# Patient Record
Sex: Female | Born: 1960 | Race: Black or African American | Hispanic: No | Marital: Married | Smoking: Current every day smoker
Health system: Southern US, Community
[De-identification: ages and names within clinical notes are randomized; demographics above are authoritative.]

## PROBLEM LIST (undated history)

## (undated) DIAGNOSIS — K219 Gastro-esophageal reflux disease without esophagitis: Secondary | ICD-10-CM

## (undated) DIAGNOSIS — J45909 Unspecified asthma, uncomplicated: Secondary | ICD-10-CM

## (undated) DIAGNOSIS — F32A Depression, unspecified: Secondary | ICD-10-CM

## (undated) DIAGNOSIS — I10 Essential (primary) hypertension: Secondary | ICD-10-CM

## (undated) DIAGNOSIS — F329 Major depressive disorder, single episode, unspecified: Secondary | ICD-10-CM

## (undated) HISTORY — PX: ROTATOR CUFF REPAIR: SHX139

## (undated) HISTORY — PX: GASTRIC BYPASS: SHX52

---

## 2009-09-13 ENCOUNTER — Emergency Department: Payer: Self-pay | Admitting: Emergency Medicine

## 2010-03-30 ENCOUNTER — Ambulatory Visit: Payer: Self-pay | Admitting: Family Medicine

## 2010-04-02 ENCOUNTER — Ambulatory Visit: Payer: Self-pay | Admitting: Family Medicine

## 2010-05-20 ENCOUNTER — Ambulatory Visit: Payer: Self-pay | Admitting: Gastroenterology

## 2010-05-22 LAB — PATHOLOGY REPORT

## 2010-06-29 ENCOUNTER — Ambulatory Visit: Payer: Self-pay

## 2010-10-14 ENCOUNTER — Ambulatory Visit: Payer: Self-pay | Admitting: Anesthesiology

## 2010-10-20 ENCOUNTER — Ambulatory Visit: Payer: Self-pay | Admitting: Orthopedic Surgery

## 2011-07-06 ENCOUNTER — Ambulatory Visit: Payer: Self-pay | Admitting: Family Medicine

## 2012-07-06 ENCOUNTER — Ambulatory Visit: Payer: Self-pay | Admitting: Family Medicine

## 2012-10-23 ENCOUNTER — Ambulatory Visit: Payer: Self-pay | Admitting: Family Medicine

## 2013-08-01 DIAGNOSIS — Z713 Dietary counseling and surveillance: Secondary | ICD-10-CM | POA: Insufficient documentation

## 2013-08-01 DIAGNOSIS — K912 Postsurgical malabsorption, not elsewhere classified: Secondary | ICD-10-CM | POA: Insufficient documentation

## 2014-03-21 ENCOUNTER — Ambulatory Visit: Payer: Self-pay | Admitting: Emergency Medicine

## 2014-07-11 ENCOUNTER — Ambulatory Visit: Payer: Self-pay | Admitting: Family Medicine

## 2014-10-14 DIAGNOSIS — M1612 Unilateral primary osteoarthritis, left hip: Secondary | ICD-10-CM | POA: Insufficient documentation

## 2014-10-21 DIAGNOSIS — G8929 Other chronic pain: Secondary | ICD-10-CM | POA: Insufficient documentation

## 2015-01-02 ENCOUNTER — Encounter: Payer: Self-pay | Admitting: Emergency Medicine

## 2015-01-02 ENCOUNTER — Ambulatory Visit
Admission: EM | Admit: 2015-01-02 | Discharge: 2015-01-02 | Disposition: A | Discharge status: Home / Self Care | Payer: BLUE CROSS/BLUE SHIELD | Attending: Family Medicine | Admitting: Family Medicine

## 2015-01-02 DIAGNOSIS — S39012A Strain of muscle, fascia and tendon of lower back, initial encounter: Secondary | ICD-10-CM | POA: Diagnosis not present

## 2015-01-02 DIAGNOSIS — M940 Chondrocostal junction syndrome [Tietze]: Secondary | ICD-10-CM

## 2015-01-02 HISTORY — DX: Essential (primary) hypertension: I10

## 2015-01-02 HISTORY — DX: Depression, unspecified: F32.A

## 2015-01-02 HISTORY — DX: Gastro-esophageal reflux disease without esophagitis: K21.9

## 2015-01-02 HISTORY — DX: Major depressive disorder, single episode, unspecified: F32.9

## 2015-01-02 MED ORDER — KETOROLAC TROMETHAMINE 60 MG/2ML IM SOLN
60.0000 mg | Freq: Once | INTRAMUSCULAR | Status: AC
  Administered 2015-01-02: 60 mg via INTRAMUSCULAR

## 2015-01-02 MED ORDER — CYCLOBENZAPRINE HCL 10 MG PO TABS: 10.0000 mg | ORAL_TABLET | Freq: Every day | ORAL | Status: DC

## 2015-01-02 MED ORDER — KETOROLAC TROMETHAMINE 10 MG PO TABS
10.0000 mg | ORAL_TABLET | Freq: Three times a day (TID) | ORAL | Status: DC | PRN
Start: 2015-01-02 — End: 2018-10-19

## 2015-01-02 NOTE — Discharge Instructions (Signed)
Back Exercises Back exercises help treat and prevent back injuries. The goal of back exercises is to increase the strength of your abdominal and back muscles and the flexibility of your back. These exercises should be started when you no longer have back pain. Back exercises include:  Pelvic Tilt. Lie on your back with your knees bent. Tilt your pelvis until the lower part of your back is against the floor. Hold this position 5 to 10 sec and repeat 5 to 10 times.  Knee to Chest. Pull first 1 knee up against your chest and hold for 20 to 30 seconds, repeat this with the other knee, and then both knees. This may be done with the other leg straight or bent, whichever feels better.  Sit-Ups or Curl-Ups. Bend your knees 90 degrees. Start with tilting your pelvis, and do a partial, slow sit-up, lifting your trunk only 30 to 45 degrees off the floor. Take at least 2 to 3 seconds for each sit-up. Do not do sit-ups with your knees out straight. If partial sit-ups are difficult, simply do the above but with only tightening your abdominal muscles and holding it as directed.  Hip-Lift. Lie on your back with your knees flexed 90 degrees. Push down with your feet and shoulders as you raise your hips a couple inches off the floor; hold for 10 seconds, repeat 5 to 10 times.  Back arches. Lie on your stomach, propping yourself up on bent elbows. Slowly press on your hands, causing an arch in your low back. Repeat 3 to 5 times. Any initial stiffness and discomfort should lessen with repetition over time.  Shoulder-Lifts. Lie face down with arms beside your body. Keep hips and torso pressed to floor as you slowly lift your head and shoulders off the floor. Do not overdo your exercises, especially in the beginning. Exercises may cause you some mild back discomfort which lasts for a few minutes; however, if the pain is more severe, or lasts for more than 15 minutes, do not continue exercises until you see your caregiver.  Improvement with exercise therapy for back problems is slow.  See your caregivers for assistance with developing a proper back exercise program. Document Released: 09/16/2004 Document Revised: 11/01/2011 Document Reviewed: 06/10/2011 [facility] Patient Information 2015 [place], [place]. This information is not intended to replace advice given to you by your health care provider. Make sure you discuss any questions you have with your health care provider. Low Back Strain with Rehab A strain is an injury in which a tendon or muscle is torn. The muscles and tendons of the lower back are vulnerable to strains. However, these muscles and tendons are very strong and require a great force to be injured. Strains are classified into three categories. Grade 1 strains cause pain, but the tendon is not lengthened. Grade 2 strains include a lengthened ligament, due to the ligament being stretched or partially ruptured. With grade 2 strains there is still function, although the function may be decreased. Grade 3 strains involve a complete tear of the tendon or muscle, and function is usually impaired. SYMPTOMS   Pain in the lower back.  Pain that affects one side more than the other.  Pain that gets worse with movement and may be felt in the hip, buttocks, or back of the thigh.  Muscle spasms of the muscles in the back.  Swelling along the muscles of the back.  Loss of strength of the back muscles.  Crackling sound (crepitation) when the muscles  are touched. CAUSES  Lower back strains occur when a force is placed on the muscles or tendons that is greater than they can handle. Common causes of injury include:  Prolonged overuse of the muscle-tendon units in the lower back, usually from incorrect posture.  A single violent injury or force applied to the back. RISK INCREASES WITH:  Sports that involve twisting forces on the spine or a lot of bending at the waist (football, rugby, weightlifting, bowling,  golf, tennis, speed skating, racquetball, swimming, running, gymnastics, diving).  Poor strength and flexibility.  Failure to warm up properly before activity.  Family history of lower back pain or disk disorders.  Previous back injury or surgery (especially fusion).  Poor posture with lifting, especially heavy objects.  Prolonged sitting, especially with poor posture. PREVENTION   Learn and use proper posture when sitting or lifting (maintain proper posture when sitting, lift using the knees and legs, not at the waist).  Warm up and stretch properly before activity.  Allow for adequate recovery between workouts.  Maintain physical fitness:  Strength, flexibility, and endurance.  Cardiovascular fitness. PROGNOSIS  If treated properly, lower back strains usually heal within 6 weeks. RELATED COMPLICATIONS   Recurring symptoms, resulting in a chronic problem.  Chronic inflammation, scarring, and partial muscle-tendon tear.  Delayed healing or resolution of symptoms.  Prolonged disability. TREATMENT  Treatment first involves the use of ice and medicine, to reduce pain and inflammation. The use of strengthening and stretching exercises may help reduce pain with activity. These exercises may be performed at home or with a therapist. Severe injuries may require referral to a therapist for further evaluation and treatment, such as ultrasound. Your caregiver may advise that you wear a back brace or corset, to help reduce pain and discomfort. Often, prolonged bed rest results in greater harm then benefit. Corticosteroid injections may be recommended. However, these should be reserved for the most serious cases. It is important to avoid using your back when lifting objects. At night, sleep on your back on a firm mattress with a pillow placed under your knees. If non-surgical treatment is unsuccessful, surgery may be needed.  MEDICATION   If pain medicine is needed, nonsteroidal  anti-inflammatory medicines (aspirin and ibuprofen), or other minor pain relievers (acetaminophen), are often advised.  Do not take pain medicine for 7 days before surgery.  Prescription pain relievers may be given, if your caregiver thinks they are needed. Use only as directed and only as much as you need.  Ointments applied to the skin may be helpful.  Corticosteroid injections may be given by your caregiver. These injections should be reserved for the most serious cases, because they may only be given a certain number of times. HEAT AND COLD  Cold treatment (icing) should be applied for 10 to 15 minutes every 2 to 3 hours for inflammation and pain, and immediately after activity that aggravates your symptoms. Use ice packs or an ice massage.  Heat treatment may be used before performing stretching and strengthening activities prescribed by your caregiver, physical therapist, or athletic trainer. Use a heat pack or a warm water soak. SEEK MEDICAL CARE IF:   Symptoms get worse or do not improve in 2 to 4 weeks, despite treatment.  You develop numbness, weakness, or loss of bowel or bladder function.  New, unexplained symptoms develop. (Drugs used in treatment may produce side effects.) EXERCISES  RANGE OF MOTION (ROM) AND STRETCHING EXERCISES - Low Back Strain Most people with lower back  pain will find that their symptoms get worse with excessive bending forward (flexion) or arching at the lower back (extension). The exercises which will help resolve your symptoms will focus on the opposite motion.  Your physician, physical therapist or athletic trainer will help you determine which exercises will be most helpful to resolve your lower back pain. Do not complete any exercises without first consulting with your caregiver. Discontinue any exercises which make your symptoms worse until you speak to your caregiver.  If you have pain, numbness or tingling which travels down into your buttocks,  leg or foot, the goal of the therapy is for these symptoms to move closer to your back and eventually resolve. Sometimes, these leg symptoms will get better, but your lower back pain may worsen. This is typically an indication of progress in your rehabilitation. Be very alert to any changes in your symptoms and the activities in which you participated in the 24 hours prior to the change. Sharing this information with your caregiver will allow him/her to most efficiently treat your condition.  These exercises may help you when beginning to rehabilitate your injury. Your symptoms may resolve with or without further involvement from your physician, physical therapist or athletic trainer. While completing these exercises, remember:  Restoring tissue flexibility helps normal motion to return to the joints. This allows healthier, less painful movement and activity.  An effective stretch should be held for at least 30 seconds.  A stretch should never be painful. You should only feel a gentle lengthening or release in the stretched tissue. FLEXION RANGE OF MOTION AND STRETCHING EXERCISES: STRETCH - Flexion, Single Knee to Chest   Lie on a firm bed or floor with both legs extended in front of you.  Keeping one leg in contact with the floor, bring your opposite knee to your chest. Hold your leg in place by either grabbing behind your thigh or at your knee.  Pull until you feel a gentle stretch in your lower back. Hold __________ seconds.  Slowly release your grasp and repeat the exercise with the opposite side. Repeat __________ times. Complete this exercise __________ times per day.  STRETCH - Flexion, Double Knee to Chest   Lie on a firm bed or floor with both legs extended in front of you.  Keeping one leg in contact with the floor, bring your opposite knee to your chest.  Tense your stomach muscles to support your back and then lift your other knee to your chest. Hold your legs in place by either  grabbing behind your thighs or at your knees.  Pull both knees toward your chest until you feel a gentle stretch in your lower back. Hold __________ seconds.  Tense your stomach muscles and slowly return one leg at a time to the floor. Repeat __________ times. Complete this exercise __________ times per day.  STRETCH - Low Trunk Rotation  Lie on a firm bed or floor. Keeping your legs in front of you, bend your knees so they are both pointed toward the ceiling and your feet are flat on the floor.  Extend your arms out to the side. This will stabilize your upper body by keeping your shoulders in contact with the floor.  Gently and slowly drop both knees together to one side until you feel a gentle stretch in your lower back. Hold for __________ seconds.  Tense your stomach muscles to support your lower back as you bring your knees back to the starting position. Repeat the exercise  to the other side. Repeat __________ times. Complete this exercise __________ times per day  EXTENSION RANGE OF MOTION AND FLEXIBILITY EXERCISES: STRETCH - Extension, Prone on Elbows   Lie on your stomach on the floor, a bed will be too soft. Place your palms about shoulder width apart and at the height of your head.  Place your elbows under your shoulders. If this is too painful, stack pillows under your chest.  Allow your body to relax so that your hips drop lower and make contact more completely with the floor.  Hold this position for __________ seconds.  Slowly return to lying flat on the floor. Repeat __________ times. Complete this exercise __________ times per day.  RANGE OF MOTION - Extension, Prone Press Ups  Lie on your stomach on the floor, a bed will be too soft. Place your palms about shoulder width apart and at the height of your head.  Keeping your back as relaxed as possible, slowly straighten your elbows while keeping your hips on the floor. You may adjust the placement of your hands to  maximize your comfort. As you gain motion, your hands will come more underneath your shoulders.  Hold this position __________ seconds.  Slowly return to lying flat on the floor. Repeat __________ times. Complete this exercise __________ times per day.  RANGE OF MOTION- Quadruped, Neutral Spine   Assume a hands and knees position on a firm surface. Keep your hands under your shoulders and your knees under your hips. You may place padding under your knees for comfort.  Drop your head and point your tail bone toward the ground below you. This will round out your lower back like an angry cat. Hold this position for __________ seconds.  Slowly lift your head and release your tail bone so that your back sags into a large arch, like an old horse.  Hold this position for __________ seconds.  Repeat this until you feel limber in your lower back.  Now, find your "sweet spot." This will be the most comfortable position somewhere between the two previous positions. This is your neutral spine. Once you have found this position, tense your stomach muscles to support your lower back.  Hold this position for __________ seconds. Repeat __________ times. Complete this exercise __________ times per day.  STRENGTHENING EXERCISES - Low Back Strain These exercises may help you when beginning to rehabilitate your injury. These exercises should be done near your "sweet spot." This is the neutral, low-back arch, somewhere between fully rounded and fully arched, that is your least painful position. When performed in this safe range of motion, these exercises can be used for people who have either a flexion or extension based injury. These exercises may resolve your symptoms with or without further involvement from your physician, physical therapist or athletic trainer. While completing these exercises, remember:   Muscles can gain both the endurance and the strength needed for everyday activities through controlled  exercises.  Complete these exercises as instructed by your physician, physical therapist or athletic trainer. Increase the resistance and repetitions only as guided.  You may experience muscle soreness or fatigue, but the pain or discomfort you are trying to eliminate should never worsen during these exercises. If this pain does worsen, stop and make certain you are following the directions exactly. If the pain is still present after adjustments, discontinue the exercise until you can discuss the trouble with your caregiver. STRENGTHENING - Deep Abdominals, Pelvic Tilt  Lie on a firm bed  or floor. Keeping your legs in front of you, bend your knees so they are both pointed toward the ceiling and your feet are flat on the floor.  Tense your lower abdominal muscles to press your lower back into the floor. This motion will rotate your pelvis so that your tail bone is scooping upwards rather than pointing at your feet or into the floor.  With a gentle tension and even breathing, hold this position for __________ seconds. Repeat __________ times. Complete this exercise __________ times per day.  STRENGTHENING - Abdominals, Crunches   Lie on a firm bed or floor. Keeping your legs in front of you, bend your knees so they are both pointed toward the ceiling and your feet are flat on the floor. Cross your arms over your chest.  Slightly tip your chin down without bending your neck.  Tense your abdominals and slowly lift your trunk high enough to just clear your shoulder blades. Lifting higher can put excessive stress on the lower back and does not further strengthen your abdominal muscles.  Control your return to the starting position. Repeat __________ times. Complete this exercise __________ times per day.  STRENGTHENING - Quadruped, Opposite UE/LE Lift   Assume a hands and knees position on a firm surface. Keep your hands under your shoulders and your knees under your hips. You may place padding  under your knees for comfort.  Find your neutral spine and gently tense your abdominal muscles so that you can maintain this position. Your shoulders and hips should form a rectangle that is parallel with the floor and is not twisted.  Keeping your trunk steady, lift your right hand no higher than your shoulder and then your left leg no higher than your hip. Make sure you are not holding your breath. Hold this position __________ seconds.  Continuing to keep your abdominal muscles tense and your back steady, slowly return to your starting position. Repeat with the opposite arm and leg. Repeat __________ times. Complete this exercise __________ times per day.  STRENGTHENING - Lower Abdominals, Double Knee Lift  Lie on a firm bed or floor. Keeping your legs in front of you, bend your knees so they are both pointed toward the ceiling and your feet are flat on the floor.  Tense your abdominal muscles to brace your lower back and slowly lift both of your knees until they come over your hips. Be certain not to hold your breath.  Hold __________ seconds. Using your abdominal muscles, return to the starting position in a slow and controlled manner. Repeat __________ times. Complete this exercise __________ times per day.  POSTURE AND BODY MECHANICS CONSIDERATIONS - Low Back Strain Keeping correct posture when sitting, standing or completing your activities will reduce the stress put on different body tissues, allowing injured tissues a chance to heal and limiting painful experiences. The following are general guidelines for improved posture. Your physician or physical therapist will provide you with any instructions specific to your needs. While reading these guidelines, remember:  The exercises prescribed by your provider will help you have the flexibility and strength to maintain correct postures.  The correct posture provides the best environment for your joints to work. All of your joints have less  wear and tear when properly supported by a spine with good posture. This means you will experience a healthier, less painful body.  Correct posture must be practiced with all of your activities, especially prolonged sitting and standing. Correct posture is as important when  doing repetitive low-stress activities (typing) as it is when doing a single heavy-load activity (lifting). RESTING POSITIONS Consider which positions are most painful for you when choosing a resting position. If you have pain with flexion-based activities (sitting, bending, stooping, squatting), choose a position that allows you to rest in a less flexed posture. You would want to avoid curling into a fetal position on your side. If your pain worsens with extension-based activities (prolonged standing, working overhead), avoid resting in an extended position such as sleeping on your stomach. Most people will find more comfort when they rest with their spine in a more neutral position, neither too rounded nor too arched. Lying on a non-sagging bed on your side with a pillow between your knees, or on your back with a pillow under your knees will often provide some relief. Keep in mind, being in any one position for a prolonged period of time, no matter how correct your posture, can still lead to stiffness. PROPER SITTING POSTURE In order to minimize stress and discomfort on your spine, you must sit with correct posture. Sitting with good posture should be effortless for a healthy body. Returning to good posture is a gradual process. Many people can work toward this most comfortably by using various supports until they have the flexibility and strength to maintain this posture on their own. When sitting with proper posture, your ears will fall over your shoulders and your shoulders will fall over your hips. You should use the back of the chair to support your upper back. Your lower back will be in a neutral position, just slightly arched. You  may place a small pillow or folded towel at the base of your lower back for support.  When working at a desk, create an environment that supports good, upright posture. Without extra support, muscles tire, which leads to excessive strain on joints and other tissues. Keep these recommendations in mind: CHAIR:  A chair should be able to slide under your desk when your back makes contact with the back of the chair. This allows you to work closely.  The chair's height should allow your eyes to be level with the upper part of your monitor and your hands to be slightly lower than your elbows. BODY POSITION  Your feet should make contact with the floor. If this is not possible, use a foot rest.  Keep your ears over your shoulders. This will reduce stress on your neck and lower back. INCORRECT SITTING POSTURES  If you are feeling tired and unable to assume a healthy sitting posture, do not slouch or slump. This puts excessive strain on your back tissues, causing more damage and pain. Healthier options include:  Using more support, like a lumbar pillow.  Switching tasks to something that requires you to be upright or walking.  Talking a brief walk.  Lying down to rest in a neutral-spine position. PROLONGED STANDING WHILE SLIGHTLY LEANING FORWARD  When completing a task that requires you to lean forward while standing in one place for a long time, place either foot up on a stationary 2-4 inch high object to help maintain the best posture. When both feet are on the ground, the lower back tends to lose its slight inward curve. If this curve flattens (or becomes too large), then the back and your other joints will experience too much stress, tire more quickly, and can cause pain. CORRECT STANDING POSTURES Proper standing posture should be assumed with all daily activities, even if they only  take a few moments, like when brushing your teeth. As in sitting, your ears should fall over your shoulders and  your shoulders should fall over your hips. You should keep a slight tension in your abdominal muscles to brace your spine. Your tailbone should point down to the ground, not behind your body, resulting in an over-extended swayback posture.  INCORRECT STANDING POSTURES  Common incorrect standing postures include a forward head, locked knees and/or an excessive swayback. WALKING Walk with an upright posture. Your ears, shoulders and hips should all line-up. PROLONGED ACTIVITY IN A FLEXED POSITION When completing a task that requires you to bend forward at your waist or lean over a low surface, try to find a way to stabilize 3 out of 4 of your limbs. You can place a hand or elbow on your thigh or rest a knee on the surface you are reaching across. This will provide you more stability so that your muscles do not fatigue as quickly. By keeping your knees relaxed, or slightly bent, you will also reduce stress across your lower back. CORRECT LIFTING TECHNIQUES DO :   Assume a wide stance. This will provide you more stability and the opportunity to get as close as possible to the object which you are lifting.  Tense your abdominals to brace your spine. Bend at the knees and hips. Keeping your back locked in a neutral-spine position, lift using your leg muscles. Lift with your legs, keeping your back straight.  Test the weight of unknown objects before attempting to lift them.  Try to keep your elbows locked down at your sides in order get the best strength from your shoulders when carrying an object.  Always ask for help when lifting heavy or awkward objects. INCORRECT LIFTING TECHNIQUES DO NOT:   Lock your knees when lifting, even if it is a small object.  Bend and twist. Pivot at your feet or move your feet when needing to change directions.  Assume that you can safely pick up even a paper clip without proper posture. Document Released: 08/09/2005 Document Revised: 11/01/2011 Document Reviewed:  11/21/2008 [facility] Patient Information 2015 [place], [place]. This information is not intended to replace advice given to you by your health care provider. Make sure you discuss any questions you have with your health care provider.

## 2015-01-02 NOTE — ED Notes (Signed)
Patient c/o left sided lower back pain for the past 2 days.  Patient denies any urinary problems.  Patient denies fevers.  Patient denies injury.

## 2015-01-02 NOTE — ED Provider Notes (Addendum)
CSN: [id number]     Arrival date & time 01/02/15  1217 History   First MD Initiated Contact with Patient 01/02/15 1256     Chief Complaint  Patient presents with  . Back Pain    left side   (Consider location/radiation/quality/duration/timing/severity/associated sxs/prior Treatment) Patient is a 54 y.o. female presenting with back pain. The history is provided by the patient.  Back Pain Location:  Lumbar spine Quality:  Stabbing and aching Radiates to:  Does not radiate Pain severity:  Moderate Worse during: worse with movement. Onset quality:  Sudden Duration:  3 days Progression:  Unchanged Chronicity:  New Context comment:  Patient states she does not recall any specific injury Relieved by:  Bed rest and heating pad Worsened by:  Movement Associated symptoms: no abdominal pain, no abdominal swelling, no bladder incontinence, no bowel incontinence, no chest pain, no dysuria, no fever, no headaches, no leg pain, no numbness, no paresthesias, no pelvic pain, no perianal numbness, no tingling, no weakness and no weight loss   Risk factors: obesity     Past Medical History  Diagnosis Date  . Hypertension   . GERD (gastroesophageal reflux disease)   . Depression    Past Surgical History  Procedure Laterality Date  . Gastric bypass    . Rotator cuff repair Right    History reviewed. No pertinent family history. History  Substance Use Topics  . Smoking status: Current Every Day Smoker -- 0.50 packs/day  . Smokeless tobacco: Never Used  . Alcohol Use: Yes   OB History    No data available     Review of Systems  Constitutional: Negative for fever and weight loss.  Cardiovascular: Negative for chest pain.  Gastrointestinal: Negative for abdominal pain and bowel incontinence.  Genitourinary: Negative for bladder incontinence, dysuria and pelvic pain.  Musculoskeletal: Positive for back pain.  Neurological: Negative for tingling, weakness, numbness, headaches and  paresthesias.    Allergies  Percocet and Vicodin  Home Medications   Prior to Admission medications   Medication Sig Start Date End Date Taking? Authorizing Provider  albuterol (PROVENTIL HFA;VENTOLIN HFA) 108 (90 BASE) MCG/ACT inhaler Inhale 2 puffs into the lungs every 6 (six) hours as needed for wheezing or shortness of breath.   Yes Historical Provider, MD  fluticasone (FLONASE) 50 MCG/ACT nasal spray Place 1 spray into both nostrils daily.   Yes Historical Provider, MD  losartan-hydrochlorothiazide (HYZAAR) 100-25 MG per tablet Take 1 tablet by mouth daily.   Yes Historical Provider, MD  omeprazole (PRILOSEC) 20 MG capsule Take 20 mg by mouth 2 (two) times daily before a meal.   Yes Historical Provider, MD  venlafaxine (EFFEXOR) 75 MG tablet Take 75 mg by mouth 3 (three) times daily with meals.   Yes Historical Provider, MD  zolpidem (AMBIEN) 5 MG tablet Take 5 mg by mouth at bedtime as needed for sleep.   Yes Historical Provider, MD  cyclobenzaprine (FLEXERIL) 10 MG tablet Take 1 tablet (10 mg total) by mouth at bedtime. 01/02/15   Payton Mccallumrlando Serita Degroote, MD  ketorolac (TORADOL) 10 MG tablet Take 1 tablet (10 mg total) by mouth every 8 (eight) hours as needed. 01/02/15   Payton Mccallumrlando Brendaliz Kuk, MD   BP 126/78 mmHg  Pulse 83  Temp(Src) 98.4 F (36.9 C) (Tympanic)  Resp 16  Ht 5\' 1"  (1.549 m)  Wt 227 lb (102.967 kg)  BMI 42.91 kg/m2  SpO2 97% Physical Exam  Constitutional: She appears well-developed and well-nourished. No distress.  Abdominal: Soft. Bowel  sounds are normal. She exhibits no distension and no mass. There is no tenderness. There is no rebound and no guarding.  No CVA tenderness  Musculoskeletal: She exhibits tenderness. She exhibits no edema.       Lumbar back: She exhibits tenderness and spasm. She exhibits normal range of motion, no bony tenderness, no swelling, no edema, no deformity, no laceration, no pain and normal pulse.  Neurological: She is alert. She has normal reflexes. She  displays normal reflexes. She exhibits normal muscle tone. Coordination normal.  Skin: Skin is warm and dry. No rash noted. She is not diaphoretic. No erythema.  Nursing note and vitals reviewed.   ED Course  Procedures (including critical care time) Labs Review Labs Reviewed - No data to display  Imaging Review No results found.   MDM   1. Low back strain, initial encounter   2. Costochondritis    New Prescriptions   CYCLOBENZAPRINE (FLEXERIL) 10 MG TABLET    Take 1 tablet (10 mg total) by mouth at bedtime.   KETOROLAC (TORADOL) 10 MG TABLET    Take 1 tablet (10 mg total) by mouth every 8 (eight) hours as needed.  Plan: 1. Diagnosis reviewed with patient 2. rx as per orders; risks, benefits, potential side effects reviewed with patient 3. Recommend supportive treatment with warm compresses and gentle stretching  4. F/u prn if symptoms worsen or don't improve    Payton Mccallumrlando Yee Joss, MD 01/02/15 1314  13:09 Medication Given HM  ketorolac (TORADOL) injection 60 mg - Dose: 60 mg ; Route: Intramuscular ; Site: Left Ventrogluteal ; Scheduled Time: 1315       Payton Mccallumrlando Draken Farrior, MD 01/22/15 1439

## 2015-07-15 DIAGNOSIS — M75122 Complete rotator cuff tear or rupture of left shoulder, not specified as traumatic: Secondary | ICD-10-CM | POA: Insufficient documentation

## 2016-03-31 ENCOUNTER — Other Ambulatory Visit: Payer: Self-pay | Admitting: Family Medicine

## 2016-03-31 DIAGNOSIS — Z1231 Encounter for screening mammogram for malignant neoplasm of breast: Secondary | ICD-10-CM

## 2016-04-06 ENCOUNTER — Other Ambulatory Visit: Payer: Self-pay | Admitting: Family Medicine

## 2016-04-06 ENCOUNTER — Ambulatory Visit
Admission: RE | Admit: 2016-04-06 | Discharge: 2016-04-06 | Disposition: A | Discharge status: Home / Self Care | Payer: BLUE CROSS/BLUE SHIELD | Source: Ambulatory Visit | Attending: Family Medicine | Admitting: Family Medicine

## 2016-04-06 DIAGNOSIS — Z1231 Encounter for screening mammogram for malignant neoplasm of breast: Secondary | ICD-10-CM | POA: Insufficient documentation

## 2016-09-23 ENCOUNTER — Encounter: Payer: Self-pay | Admitting: Emergency Medicine

## 2016-09-23 ENCOUNTER — Ambulatory Visit (INDEPENDENT_AMBULATORY_CARE_PROVIDER_SITE_OTHER)
Admission: EM | Admit: 2016-09-23 | Discharge: 2016-09-23 | Disposition: A | Discharge status: Other Acute Inpatient Hospital | Payer: BLUE CROSS/BLUE SHIELD | Source: Home / Self Care | Attending: Family Medicine | Admitting: Family Medicine

## 2016-09-23 ENCOUNTER — Emergency Department: Payer: BLUE CROSS/BLUE SHIELD

## 2016-09-23 ENCOUNTER — Encounter: Payer: Self-pay | Admitting: *Deleted

## 2016-09-23 ENCOUNTER — Emergency Department
Admission: EM | Admit: 2016-09-23 | Discharge: 2016-09-23 | Disposition: A | Discharge status: Home / Self Care | Payer: BLUE CROSS/BLUE SHIELD | Attending: Emergency Medicine | Admitting: Emergency Medicine

## 2016-09-23 DIAGNOSIS — Z79899 Other long term (current) drug therapy: Secondary | ICD-10-CM | POA: Insufficient documentation

## 2016-09-23 DIAGNOSIS — R079 Chest pain, unspecified: Secondary | ICD-10-CM | POA: Diagnosis not present

## 2016-09-23 DIAGNOSIS — I10 Essential (primary) hypertension: Secondary | ICD-10-CM | POA: Diagnosis not present

## 2016-09-23 DIAGNOSIS — R0789 Other chest pain: Secondary | ICD-10-CM

## 2016-09-23 DIAGNOSIS — F17210 Nicotine dependence, cigarettes, uncomplicated: Secondary | ICD-10-CM | POA: Diagnosis not present

## 2016-09-23 DIAGNOSIS — J45909 Unspecified asthma, uncomplicated: Secondary | ICD-10-CM | POA: Insufficient documentation

## 2016-09-23 DIAGNOSIS — R05 Cough: Secondary | ICD-10-CM | POA: Diagnosis not present

## 2016-09-23 DIAGNOSIS — R071 Chest pain on breathing: Secondary | ICD-10-CM

## 2016-09-23 DIAGNOSIS — R059 Cough, unspecified: Secondary | ICD-10-CM

## 2016-09-23 HISTORY — DX: Unspecified asthma, uncomplicated: J45.909

## 2016-09-23 LAB — CBC
HCT: 38.1 % (ref 35.0–47.0)
HEMOGLOBIN: 12.6 g/dL (ref 12.0–16.0)
MCH: 27.8 pg (ref 26.0–34.0)
MCHC: 32.9 g/dL (ref 32.0–36.0)
MCV: 84.3 fL (ref 80.0–100.0)
Platelets: 296 10*3/uL (ref 150–440)
RBC: 4.52 MIL/uL (ref 3.80–5.20)
RDW: 16.2 % — ABNORMAL HIGH (ref 11.5–14.5)
WBC: 9.8 10*3/uL (ref 3.6–11.0)

## 2016-09-23 LAB — BASIC METABOLIC PANEL
ANION GAP: 10 (ref 5–15)
BUN: 11 mg/dL (ref 6–20)
CHLORIDE: 103 mmol/L (ref 101–111)
CO2: 25 mmol/L (ref 22–32)
Calcium: 8.8 mg/dL — ABNORMAL LOW (ref 8.9–10.3)
Creatinine, Ser: 0.66 mg/dL (ref 0.44–1.00)
GFR calc non Af Amer: >60 mL/min (ref >60)
Glucose, Bld: 88 mg/dL (ref 65–99)
Potassium: 3.7 mmol/L (ref 3.5–5.1)
Sodium: 138 mmol/L (ref 135–145)

## 2016-09-23 LAB — TROPONIN I

## 2016-09-23 NOTE — ED Triage Notes (Signed)
Patient started having cough, nasal congestion 2 months ago. Patient know has chest soreness from coughing. The symptom of SOB started today.
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
## 2016-09-23 NOTE — ED Triage Notes (Signed)
Pt in via POV with complaints of centralized chest pain and shortness of breath x 2 months; pt [facility] dx of Bronchitis, prescribed antibiotics and steroid pack.  Pt reports pain and shortness of breath is unresolved.  Pt also reports pain to left ribcage from the coughing.  Pt denies any fever.  NAD noted at this time.

## 2016-09-23 NOTE — ED Provider Notes (Signed)
[facility] Emergency Department Provider Note  ____________________________________________   First MD Initiated Contact with Patient 09/23/16 2019     (approximate)  I have reviewed the triage vital signs and the nursing notes.   HISTORY  Chief Complaint Chest Pain   HPI Kristen Velasquez is a 56 y.o. female with history of hypertension who has been having a cough over the past several months associated with sinus pressure and a runny nose. She says that she has now begun to have chest pain and today experienced worsening of her chest pain when breathing deeply. She says that she has been through several rounds of Mucinex, steroids and is using Flonase and an inhaler without any relief. She says that she presents worse when she lies back but attributes some of this to her obstructive sleep apnea. She does wear CPAP machine in the evening. Says that her pain is constant and is a 5 out of 10 at this time. She says it does worsen with coughing as well as specially to the anterior and left side of her chest and the left upper thoracic back.   Past Medical History:  Diagnosis Date  . Asthma   . Depression   . GERD (gastroesophageal reflux disease)   . Hypertension     There are no active problems to display for this patient.   Past Surgical History:  Procedure Laterality Date  . GASTRIC BYPASS    . ROTATOR CUFF REPAIR Right     Prior to Admission medications   Medication Sig Start Date End Date Taking? Authorizing Provider  albuterol (PROVENTIL HFA;VENTOLIN HFA) 108 (90 BASE) MCG/ACT inhaler Inhale 2 puffs into the lungs every 6 (six) hours as needed for wheezing or shortness of breath.    Historical Provider, MD  cyclobenzaprine (FLEXERIL) 10 MG tablet Take 1 tablet (10 mg total) by mouth at bedtime. 01/02/15   Payton Mccallum, MD  fluticasone (FLONASE) 50 MCG/ACT nasal spray Place 1 spray into both nostrils daily.    Historical Provider, MD  ketorolac  (TORADOL) 10 MG tablet Take 1 tablet (10 mg total) by mouth every 8 (eight) hours as needed. 01/02/15   Payton Mccallum, MD  losartan-hydrochlorothiazide (HYZAAR) 100-25 MG per tablet Take 1 tablet by mouth daily.    Historical Provider, MD  Multiple Vitamins-Minerals (MULTIVITAMIN ADULT PO) Take 1 tablet by mouth daily.    Historical Provider, MD  omeprazole (PRILOSEC) 20 MG capsule Take 20 mg by mouth 2 (two) times daily before a meal.    Historical Provider, MD  venlafaxine (EFFEXOR) 75 MG tablet Take 75 mg by mouth 3 (three) times daily with meals.    Historical Provider, MD  zolpidem (AMBIEN) 5 MG tablet Take 5 mg by mouth at bedtime as needed for sleep.    Historical Provider, MD    Allergies Percocet [oxycodone-acetaminophen] and Vicodin [hydrocodone-acetaminophen]  Family History  Problem Relation Age of Onset  . Breast cancer Neg Hx     Social History Social History  Substance Use Topics  . Smoking status: Current Every Day Smoker    Packs/day: 0.50    Types: Cigarettes  . Smokeless tobacco: Never Used  . Alcohol use 8.4 oz/week    14 Cans of beer per week    Review of Systems Constitutional: No fever/chills Eyes: No visual changes. ENT: No sore throat. Cardiovascular: as above. Respiratory: as above. Gastrointestinal: No abdominal pain.  No nausea, no vomiting.  No diarrhea.  No constipation. Genitourinary: Negative for  dysuria. Musculoskeletal: Negative for back pain. Skin: Negative for rash. Neurological: Negative for headaches, focal weakness or numbness.  10-point ROS otherwise negative.  ____________________________________________   PHYSICAL EXAM:  VITAL SIGNS: ED Triage Vitals  Enc Vitals Group     BP 09/23/16 1743 136/75     Pulse Rate 09/23/16 1743 62     Resp 09/23/16 1743 18     Temp 09/23/16 1743 98.3 F (36.8 C)     Temp Source 09/23/16 1743 Oral     SpO2 09/23/16 1743 98 %     Weight 09/23/16 1743 205 lb (93 kg)     Height 09/23/16 1743 5'  1" (1.549 m)     Head Circumference --      Peak Flow --      Pain Score 09/23/16 1758 5     Pain Loc --      Pain Edu? --      Excl. in GC? --     Constitutional: Alert and oriented. Well appearing and in no acute distress. Eyes: Conjunctivae are normal. PERRL. EOMI. Head: Atraumatic. Nose: Clear rhinorrhea to the bilateral nares. Mouth/Throat: Mucous membranes are moist.  No erythema to the pharynx. Neck: No stridor.   Cardiovascular: Normal rate, regular rhythm. Grossly normal heart sounds.  Good peripheral circulation with equal and intact radial as well as dorsalis pedis pulses. His pain is reproducible both anteriorly and posteriorly over the chest as well as thoracic back. Respiratory: Normal respiratory effort.  No retractions. Lungs CTAB. Gastrointestinal: Soft and nontender. No distention.  Musculoskeletal: No lower extremity tenderness nor edema.  No joint effusions. Neurologic:  Normal speech and language. No gross focal neurologic deficits are appreciated.  Skin:  Skin is warm, dry and intact. No rash noted. Psychiatric: Mood and affect are normal. Speech and behavior are normal.  ____________________________________________   LABS (all labs ordered are listed, but only abnormal results are displayed)  Labs Reviewed  BASIC METABOLIC PANEL - Abnormal; Notable for the following:       Result Value   Calcium 8.8 (*)    All other components within normal limits  CBC - Abnormal; Notable for the following:    RDW 16.2 (*)    All other components within normal limits  TROPONIN I   ____________________________________________  EKG  ED ECG REPORT I, Arelia Longest, the attending physician, personally viewed and interpreted this ECG.   Date: 09/23/2016  EKG Time: 1743  Rate: 62  Rhythm: normal sinus rhythm  Axis: Normal axis  Intervals:none  ST&T Change: No ST segment elevation or depression. No abnormal T-wave  inversion.  ____________________________________________  RADIOLOGY  DG Chest 2 View (Final result)  Result time 09/23/16 18:21:36  Final result by Charlott Holler, MD (09/23/16 18:21:36)           Narrative:   CLINICAL DATA: Central chest pain and dyspnea for 2 months.  EXAM: CHEST 2 VIEW  COMPARISON: 09/13/2009  FINDINGS: The heart size and mediastinal contours are within normal limits. Both lungs are clear. The visualized skeletal structures are unremarkable.  IMPRESSION: No active cardiopulmonary disease.   Electronically Signed By: Ellery Plunk M.D. On: 09/23/2016 18:21          ____________________________________________   PROCEDURES  Procedure(s) performed:   Procedures  Critical Care performed:   ____________________________________________   INITIAL IMPRESSION / ASSESSMENT AND PLAN / ED COURSE  Pertinent labs & imaging results that were available during my care of the patient were reviewed  by me and considered in my medical decision making (see chart for details).  Patient with normal EKG after months of symptoms. Also with negative troponin. Reproducible chest pain that is especially worsened with coughing. Does have a pleuritic component. However, very unlikely to be pulmonary embolus. Very reassuring vital signs. Pulmonary embolus would be very atypical with constellation of symptoms. The patient will also try an inhaler in addition to her multiple nasal decongestants and cough suppressants. I also suggested follow-up with ENT and will give the patient does follow up information on her discharge instructions. She is understanding of the plan and willing to comply.      ____________________________________________   FINAL CLINICAL IMPRESSION(S) / ED DIAGNOSES  Cough. Chest pain.    NEW MEDICATIONS STARTED DURING THIS VISIT:  New Prescriptions   No medications on file     Note:  This document was prepared using  Dragon voice recognition software and may include unintentional dictation errors.    Myrna Blazeravid Matthew Schaevitz, MD 09/23/16 2151

## 2017-05-25 ENCOUNTER — Other Ambulatory Visit: Payer: Self-pay | Admitting: Family Medicine

## 2017-05-25 DIAGNOSIS — Z1231 Encounter for screening mammogram for malignant neoplasm of breast: Secondary | ICD-10-CM

## 2017-06-21 ENCOUNTER — Ambulatory Visit: Payer: BLUE CROSS/BLUE SHIELD

## 2017-07-23 IMAGING — MG MM DIGITAL SCREENING BILAT W/ TOMO W/ CAD
8 of 12 series · 8 of 28 positions shown · non-contrast
Comparison: Previous exam(s).

CLINICAL DATA: Screening.

EXAM:
2D DIGITAL SCREENING BILATERAL MAMMOGRAM WITH CAD AND ADJUNCT TOMO

[L MLO synth-2D]
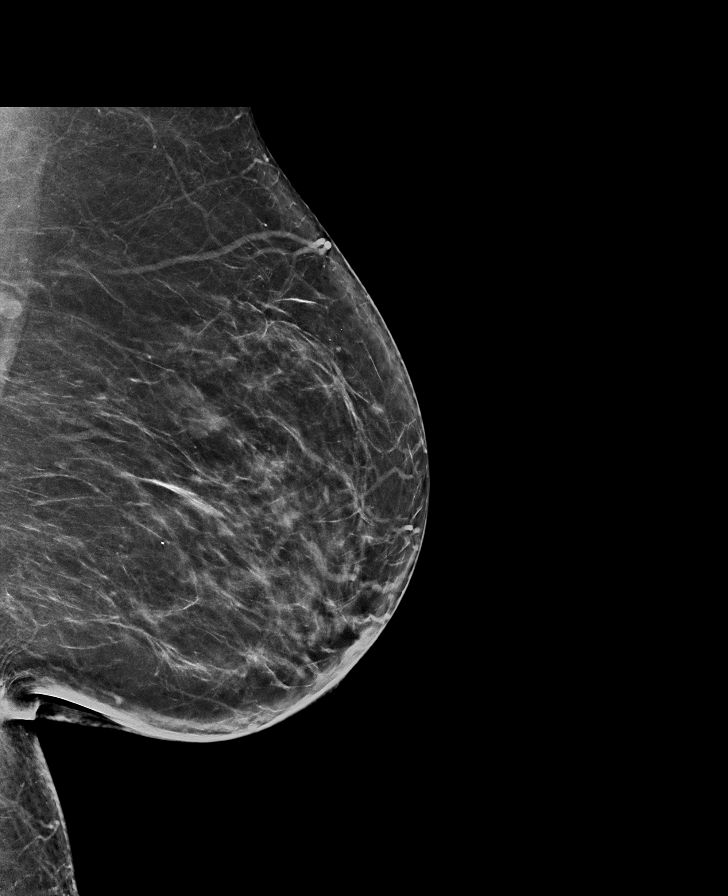

[R CC]
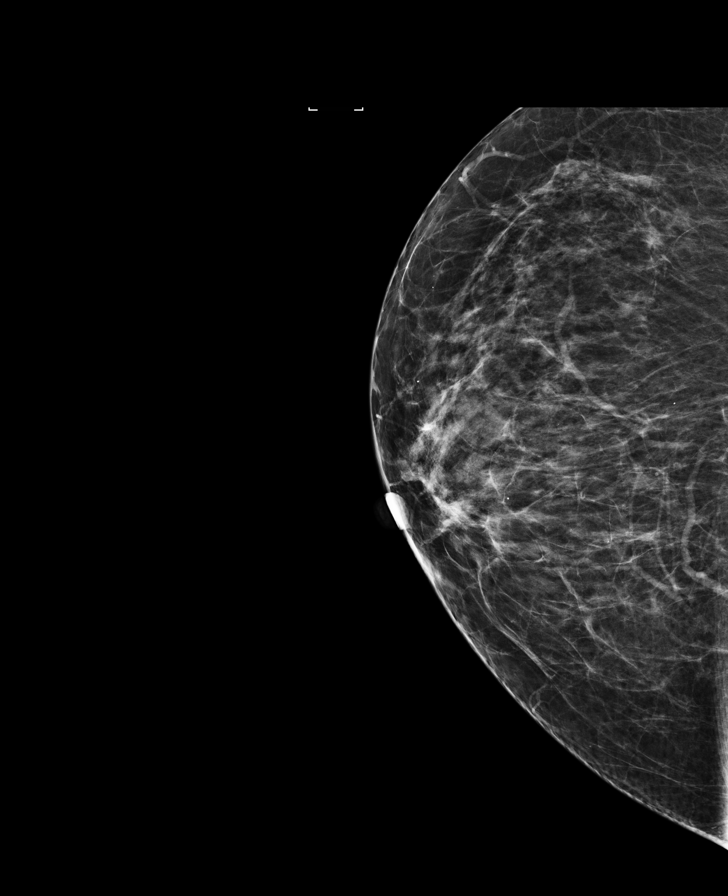

[R CC synth-2D]
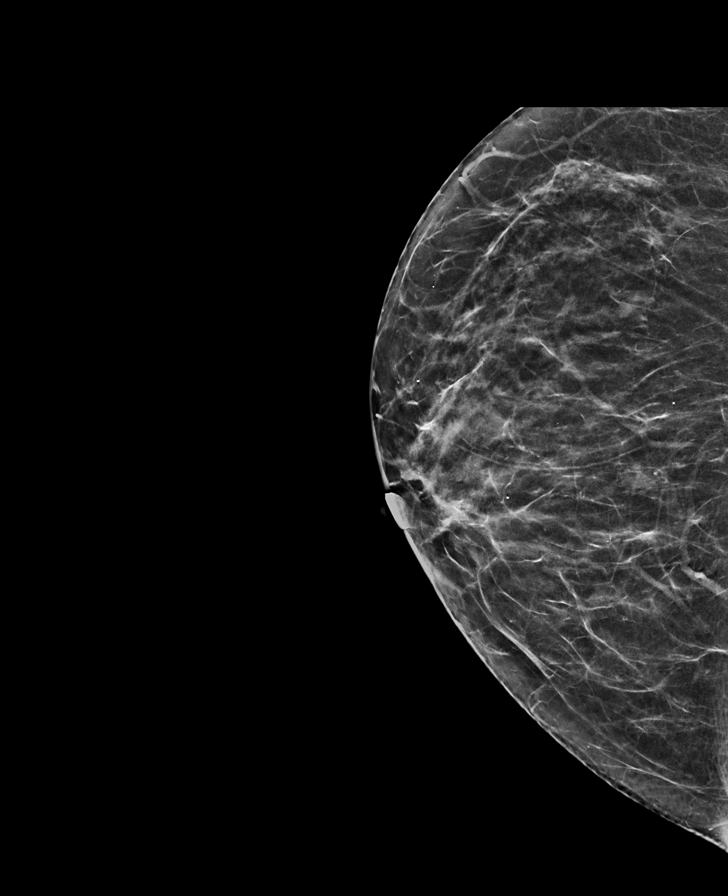

[L CC synth-2D]
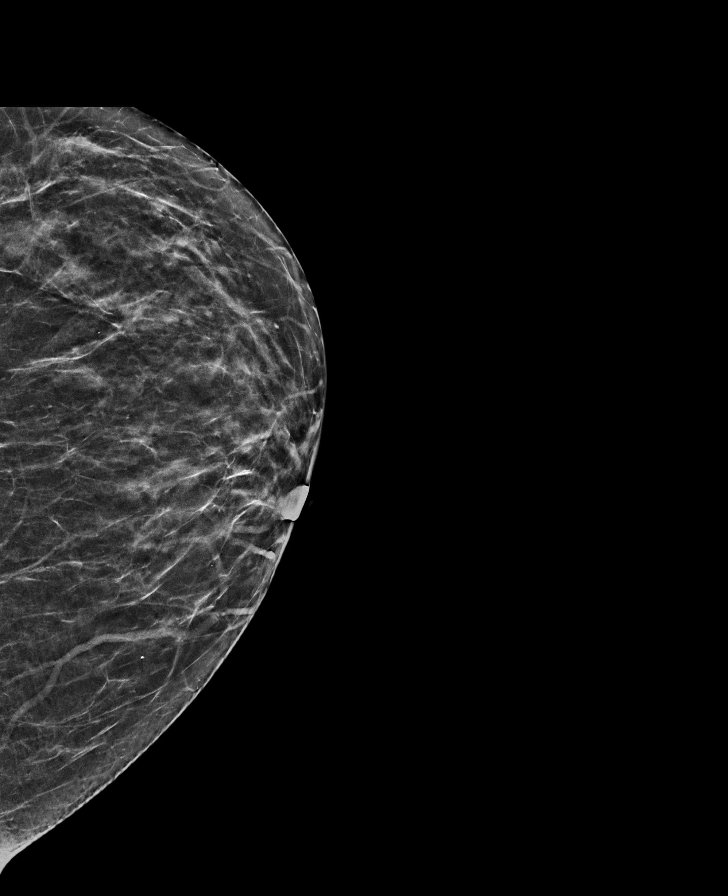

[L MLO]
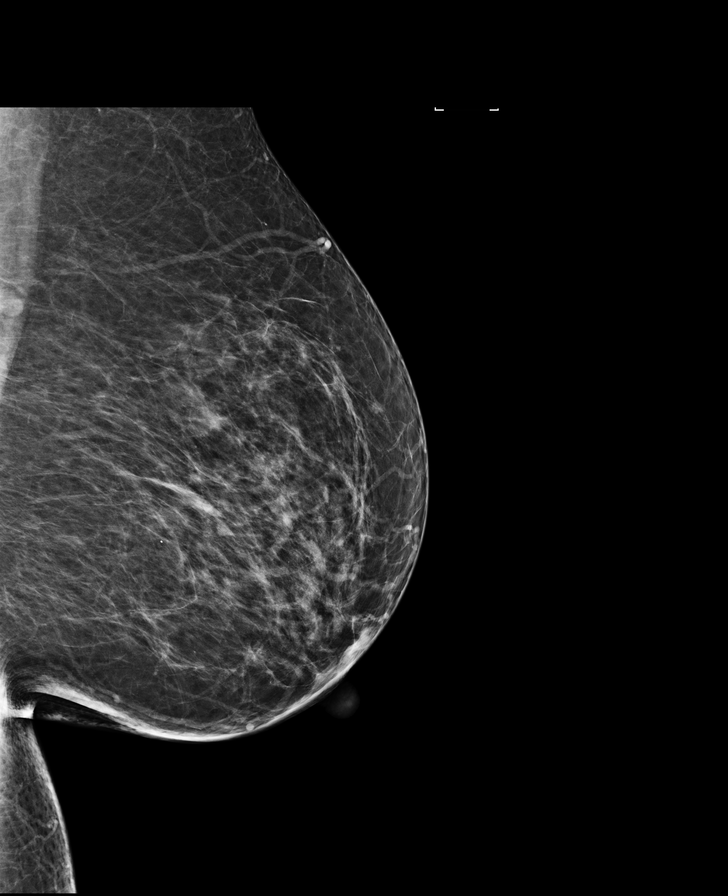

[R MLO]
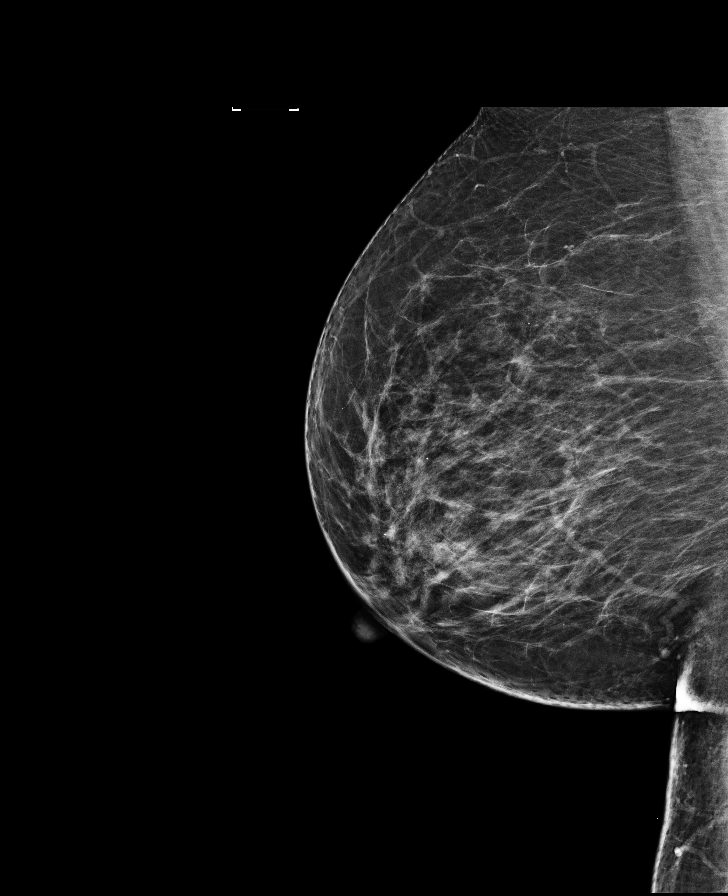

[L CC]
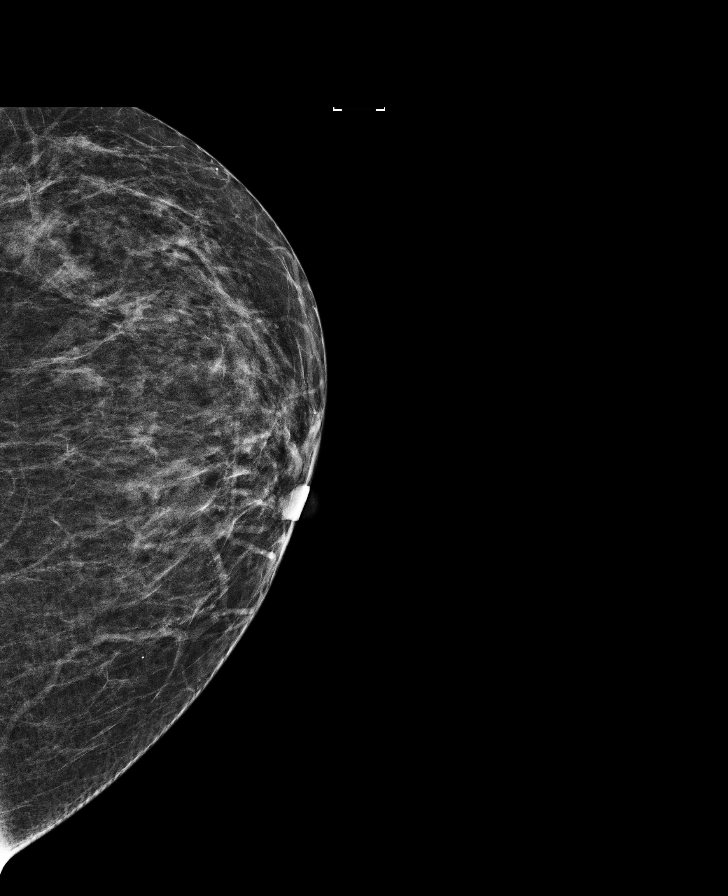

[R MLO synth-2D]
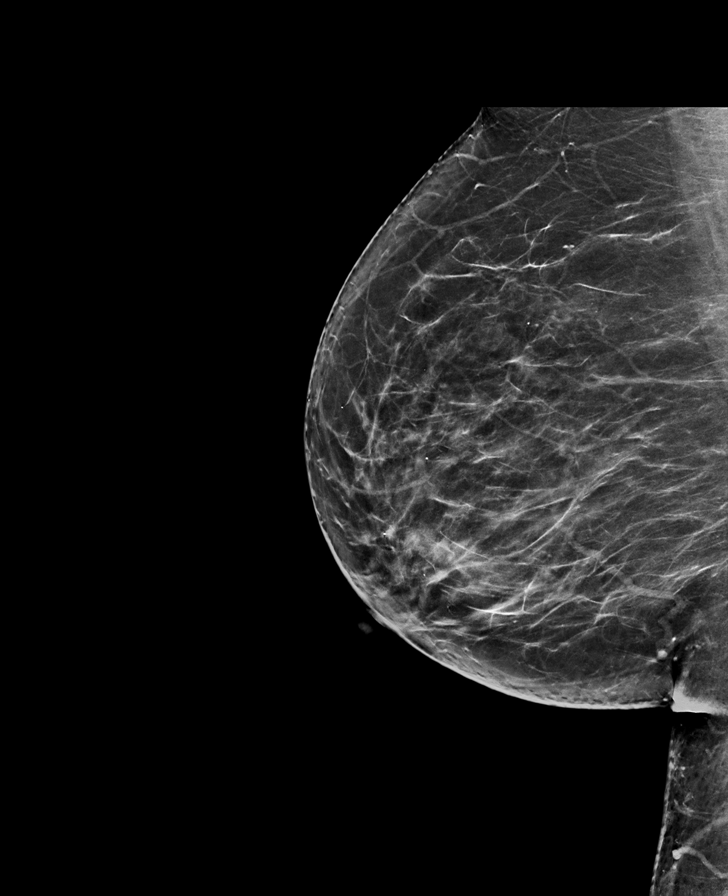

[8 of 28 positions shown; findings below may reference images not displayed]

ACR Breast Density Category c: The breast tissue is heterogeneously
dense, which may obscure small masses.
FINDINGS: There are no findings suspicious for malignancy. Images were
processed with CAD.
IMPRESSION: No mammographic evidence of malignancy. A result letter of this
screening mammogram will be mailed directly to the patient.

RECOMMENDATION:
Screening mammogram in one year. (Code:TN-[id number])

BI-RADS CATEGORY  1: Negative.

## 2017-07-25 ENCOUNTER — Other Ambulatory Visit: Payer: Self-pay | Admitting: Family Medicine

## 2017-07-25 DIAGNOSIS — N950 Postmenopausal bleeding: Secondary | ICD-10-CM

## 2017-08-01 ENCOUNTER — Ambulatory Visit: Admission: RE | Admit: 2017-08-01 | Payer: BLUE CROSS/BLUE SHIELD | Source: Ambulatory Visit

## 2017-10-29 ENCOUNTER — Emergency Department: Payer: BLUE CROSS/BLUE SHIELD

## 2017-10-29 ENCOUNTER — Encounter: Payer: Self-pay | Admitting: Emergency Medicine

## 2017-10-29 ENCOUNTER — Other Ambulatory Visit: Payer: Self-pay

## 2017-10-29 ENCOUNTER — Inpatient Hospital Stay
Admission: EM | Admit: 2017-10-29 | Discharge: 2017-10-31 | DRG: 440 | Disposition: A | Discharge status: Home / Self Care | Payer: BLUE CROSS/BLUE SHIELD | Attending: Internal Medicine | Admitting: Internal Medicine

## 2017-10-29 DIAGNOSIS — K852 Alcohol induced acute pancreatitis without necrosis or infection: Principal | ICD-10-CM | POA: Diagnosis present

## 2017-10-29 DIAGNOSIS — J45909 Unspecified asthma, uncomplicated: Secondary | ICD-10-CM | POA: Diagnosis present

## 2017-10-29 DIAGNOSIS — Z9884 Bariatric surgery status: Secondary | ICD-10-CM | POA: Diagnosis not present

## 2017-10-29 DIAGNOSIS — R1013 Epigastric pain: Secondary | ICD-10-CM

## 2017-10-29 DIAGNOSIS — Z79899 Other long term (current) drug therapy: Secondary | ICD-10-CM

## 2017-10-29 DIAGNOSIS — K219 Gastro-esophageal reflux disease without esophagitis: Secondary | ICD-10-CM | POA: Diagnosis present

## 2017-10-29 DIAGNOSIS — Z885 Allergy status to narcotic agent status: Secondary | ICD-10-CM

## 2017-10-29 DIAGNOSIS — F17210 Nicotine dependence, cigarettes, uncomplicated: Secondary | ICD-10-CM | POA: Diagnosis present

## 2017-10-29 DIAGNOSIS — K859 Acute pancreatitis without necrosis or infection, unspecified: Secondary | ICD-10-CM | POA: Diagnosis present

## 2017-10-29 DIAGNOSIS — I10 Essential (primary) hypertension: Secondary | ICD-10-CM | POA: Diagnosis present

## 2017-10-29 DIAGNOSIS — F1010 Alcohol abuse, uncomplicated: Secondary | ICD-10-CM | POA: Diagnosis present

## 2017-10-29 DIAGNOSIS — F329 Major depressive disorder, single episode, unspecified: Secondary | ICD-10-CM | POA: Diagnosis present

## 2017-10-29 LAB — URINALYSIS, COMPLETE (UACMP) WITH MICROSCOPIC
BILIRUBIN URINE: NEGATIVE
Bacteria, UA: NONE SEEN
GLUCOSE, UA: NEGATIVE mg/dL
Ketones, ur: 80 mg/dL — AB
LEUKOCYTES UA: NEGATIVE
Nitrite: NEGATIVE
PROTEIN: 100 mg/dL — AB
Specific Gravity, Urine: 1.027 (ref 1.005–1.030)
pH: 5 (ref 5.0–8.0)

## 2017-10-29 LAB — COMPREHENSIVE METABOLIC PANEL
ALT: 33 U/L (ref 14–54)
ANION GAP: 15 (ref 5–15)
AST: 49 U/L — ABNORMAL HIGH (ref 15–41)
Albumin: 4 g/dL (ref 3.5–5.0)
Alkaline Phosphatase: 118 U/L (ref 38–126)
BUN: 11 mg/dL (ref 6–20)
CALCIUM: 9.1 mg/dL (ref 8.9–10.3)
CHLORIDE: 101 mmol/L (ref 101–111)
CO2: 22 mmol/L (ref 22–32)
Creatinine, Ser: 0.8 mg/dL (ref 0.44–1.00)
GFR calc non Af Amer: >60 mL/min (ref >60)
Glucose, Bld: 168 mg/dL — ABNORMAL HIGH (ref 65–99)
Potassium: 3.3 mmol/L — ABNORMAL LOW (ref 3.5–5.1)
SODIUM: 138 mmol/L (ref 135–145)
Total Bilirubin: 2.9 mg/dL — ABNORMAL HIGH (ref 0.3–1.2)
Total Protein: 7.9 g/dL (ref 6.5–8.1)

## 2017-10-29 LAB — CBC
HCT: 42.2 % (ref 35.0–47.0)
HEMOGLOBIN: 14 g/dL (ref 12.0–16.0)
MCH: 29 pg (ref 26.0–34.0)
MCHC: 33.2 g/dL (ref 32.0–36.0)
MCV: 87.4 fL (ref 80.0–100.0)
Platelets: 263 10*3/uL (ref 150–440)
RBC: 4.82 MIL/uL (ref 3.80–5.20)
RDW: 14.5 % (ref 11.5–14.5)
WBC: 10.5 10*3/uL (ref 3.6–11.0)

## 2017-10-29 LAB — LIPASE, BLOOD: LIPASE: 729 U/L — AB (ref 11–51)

## 2017-10-29 MED ORDER — ONDANSETRON HCL 4 MG PO TABS: 4.0000 mg | ORAL_TABLET | Freq: Four times a day (QID) | ORAL | Status: DC | PRN

## 2017-10-29 MED ORDER — ACETAMINOPHEN 325 MG PO TABS: 650.0000 mg | ORAL_TABLET | Freq: Four times a day (QID) | ORAL | Status: DC | PRN

## 2017-10-29 MED ORDER — ZOLPIDEM TARTRATE 5 MG PO TABS
5.0000 mg | ORAL_TABLET | Freq: Every evening | ORAL | Status: DC | PRN
  Administered 2017-10-29: 5 mg via ORAL
  Filled 2017-10-29: qty 1

## 2017-10-29 MED ORDER — MORPHINE SULFATE (PF) 2 MG/ML IV SOLN
2.0000 mg | INTRAVENOUS | Status: DC | PRN
  Administered 2017-10-29 – 2017-10-30 (×2): 2 mg via INTRAVENOUS
  Filled 2017-10-29 (×2): qty 1

## 2017-10-29 MED ORDER — ALBUTEROL SULFATE (2.5 MG/3ML) 0.083% IN NEBU: 3.0000 mL | INHALATION_SOLUTION | Freq: Four times a day (QID) | RESPIRATORY_TRACT | Status: DC | PRN

## 2017-10-29 MED ORDER — CYCLOBENZAPRINE HCL 10 MG PO TABS
10.0000 mg | ORAL_TABLET | Freq: Every day | ORAL | Status: DC
  Administered 2017-10-29 – 2017-10-30 (×2): 10 mg via ORAL
  Filled 2017-10-29 (×2): qty 1

## 2017-10-29 MED ORDER — LORAZEPAM 2 MG/ML IJ SOLN: 1.0000 mg | Freq: Four times a day (QID) | INTRAMUSCULAR | Status: DC | PRN

## 2017-10-29 MED ORDER — THIAMINE HCL 100 MG/ML IJ SOLN: 100.0000 mg | Freq: Every day | INTRAMUSCULAR | Status: DC

## 2017-10-29 MED ORDER — ACETAMINOPHEN 650 MG RE SUPP: 650.0000 mg | Freq: Four times a day (QID) | RECTAL | Status: DC | PRN

## 2017-10-29 MED ORDER — LORAZEPAM 1 MG PO TABS: 1.0000 mg | ORAL_TABLET | Freq: Four times a day (QID) | ORAL | Status: DC | PRN

## 2017-10-29 MED ORDER — SODIUM CHLORIDE 0.9 % IV SOLN
INTRAVENOUS | Status: DC
  Administered 2017-10-29 – 2017-10-31 (×2): via INTRAVENOUS

## 2017-10-29 MED ORDER — VENLAFAXINE HCL 37.5 MG PO TABS
75.0000 mg | ORAL_TABLET | Freq: Three times a day (TID) | ORAL | Status: DC
  Administered 2017-10-30 – 2017-10-31 (×5): 75 mg via ORAL
  Filled 2017-10-29 (×8): qty 2

## 2017-10-29 MED ORDER — VITAMIN B-1 100 MG PO TABS
100.0000 mg | ORAL_TABLET | Freq: Every day | ORAL | Status: DC
  Administered 2017-10-30 – 2017-10-31 (×2): 100 mg via ORAL
  Filled 2017-10-29 (×2): qty 1

## 2017-10-29 MED ORDER — ONDANSETRON HCL 4 MG/2ML IJ SOLN
4.0000 mg | Freq: Once | INTRAMUSCULAR | Status: AC
  Administered 2017-10-29: 4 mg via INTRAVENOUS
  Filled 2017-10-29: qty 2

## 2017-10-29 MED ORDER — FLUTICASONE PROPIONATE 50 MCG/ACT NA SUSP
1.0000 | Freq: Every day | NASAL | Status: DC
Start: 2017-10-30 — End: 2017-10-31
  Administered 2017-10-30 – 2017-10-31 (×2): 1 via NASAL
  Filled 2017-10-29 (×3): qty 16

## 2017-10-29 MED ORDER — FENTANYL CITRATE (PF) 100 MCG/2ML IJ SOLN
50.0000 ug | Freq: Once | INTRAMUSCULAR | Status: AC
  Administered 2017-10-29: 50 ug via INTRAVENOUS
  Filled 2017-10-29: qty 2

## 2017-10-29 MED ORDER — SODIUM CHLORIDE 0.9 % IV BOLUS (SEPSIS)
1000.0000 mL | Freq: Once | INTRAVENOUS | Status: AC
  Administered 2017-10-29: 1000 mL via INTRAVENOUS

## 2017-10-29 MED ORDER — ADULT MULTIVITAMIN W/MINERALS CH
1.0000 | ORAL_TABLET | Freq: Every day | ORAL | Status: DC
  Administered 2017-10-30 – 2017-10-31 (×2): 1 via ORAL
  Filled 2017-10-29 (×2): qty 1

## 2017-10-29 MED ORDER — ENOXAPARIN SODIUM 40 MG/0.4ML ~~LOC~~ SOLN
40.0000 mg | SUBCUTANEOUS | Status: DC
  Administered 2017-10-30: 40 mg via SUBCUTANEOUS
  Filled 2017-10-29: qty 0.4

## 2017-10-29 MED ORDER — TRAMADOL HCL 50 MG PO TABS
50.0000 mg | ORAL_TABLET | Freq: Four times a day (QID) | ORAL | Status: DC | PRN
  Administered 2017-10-30 – 2017-10-31 (×3): 50 mg via ORAL
  Filled 2017-10-29 (×3): qty 1

## 2017-10-29 MED ORDER — FOLIC ACID 1 MG PO TABS
1.0000 mg | ORAL_TABLET | Freq: Every day | ORAL | Status: DC
  Administered 2017-10-30 – 2017-10-31 (×2): 1 mg via ORAL
  Filled 2017-10-29 (×2): qty 1

## 2017-10-29 MED ORDER — ONDANSETRON HCL 4 MG/2ML IJ SOLN: 4.0000 mg | Freq: Four times a day (QID) | INTRAMUSCULAR | Status: DC | PRN

## 2017-10-29 NOTE — ED Provider Notes (Signed)
[facility] Emergency Department Provider Note   ____________________________________________   I have reviewed the triae vital signs and the nursing notes.   HISTORY  Chief Complaint Cough; Abdominal Pain; and Emesis   History limited by: Not Limited   HPI Kristen Velasquez is a 57 y.o. female who presents to the emergency department today with abdominal pain.  Is located in the central abdomen.  It has been present for the past 4-5 days.  Patient denies any unusual ingestions prior to the pain starting.  Since that time it does wax and wane however is been fairly constant.  It is severe.  Patient has had some nausea with this.  She denies any bloody stool.  She did have some diarrhea before the symptoms started.  No new medications.  Per medical record review patient has a history of gastric bypass  Past Medical History:  Diagnosis Date  . Asthma   . Depression   . GERD (gastroesophageal reflux disease)   . Hypertension     There are no active problems to display for this patient.   Past Surgical History:  Procedure Laterality Date  . GASTRIC BYPASS    . ROTATOR CUFF REPAIR Right     Prior to Admission medications   Medication Sig Start Date End Date Taking? Authorizing Provider  albuterol (PROVENTIL HFA;VENTOLIN HFA) 108 (90 BASE) MCG/ACT inhaler Inhale 2 puffs into the lungs every 6 (six) hours as needed for wheezing or shortness of breath.    [provider]  cyclobenzaprine (FLEXERIL) 10 MG tablet Take 1 tablet (10 mg total) by mouth at bedtime. 01/02/15   Payton Mccallumonty, [place], MD  fluticasone (FLONASE) 50 MCG/ACT nasal spray Place 1 spray into both nostrils daily.    [provider]  ketorolac (TORADOL) 10 MG tablet Take 1 tablet (10 mg total) by mouth every 8 (eight) hours as needed. 01/02/15   Payton Mccallumonty, [place], MD  losartan-hydrochlorothiazide (HYZAAR) 100-25 MG per tablet Take 1 tablet by mouth daily.    [provider]   Multiple Vitamins-Minerals (MULTIVITAMIN ADULT PO) Take 1 tablet by mouth daily.    [provider]  omeprazole (PRILOSEC) 20 MG capsule Take 20 mg by mouth 2 (two) times daily before a meal.    [provider]  venlafaxine (EFFEXOR) 75 MG tablet Take 75 mg by mouth 3 (three) times daily with meals.    [provider]  zolpidem (AMBIEN) 5 MG tablet Take 5 mg by mouth at bedtime as needed for sleep.    [provider]    Allergies Percocet [oxycodone-acetaminophen] and Vicodin [hydrocodone-acetaminophen]  Family History  Problem Relation Age of Onset  . Breast cancer Neg Hx     Social History Social History   Tobacco Use  . Smoking status: Current Every Day Smoker    Packs/day: 0.50    Types: Cigarettes  . Smokeless tobacco: Never Used  Substance Use Topics  . Alcohol use: Yes    Alcohol/week: 8.4 oz    Types: 14 Cans of beer per week  . Drug use: No    Review of Systems Constitutional: No fever/chills Eyes: No visual changes. ENT: No sore throat. Cardiovascular: Denies chest pain. Respiratory: Denies shortness of breath. Gastrointestinal: Positive for abdominal pain. Genitourinary: Negative for dysuria. Musculoskeletal: Negative for back pain. Skin: Negative for rash. Neurological: Negative for headaches, focal weakness or numbness.  ____________________________________________   PHYSICAL EXAM:  VITAL SIGNS: ED Triage Vitals  Enc Vitals Group  BP 10/29/17 1338 137/89     Pulse Rate 10/29/17 1338 98     Resp 10/29/17 1338 18     Temp 10/29/17 1338 99.2 F (37.3 C)     Temp Source 10/29/17 1338 Oral     SpO2 10/29/17 1338 100 %     Weight 10/29/17 1339 188 lb (85.3 kg)     Height --      Head Circumference --      Peak Flow --      Pain Score 10/29/17 1338 8   Constitutional: Alert and oriented. Well appearing and in no distress. Eyes: Conjunctivae are normal.  ENT   Head: Normocephalic and atraumatic.    Nose: No congestion/rhinnorhea.   Mouth/Throat: Mucous membranes are moist.   Neck: No stridor. Hematological/Lymphatic/Immunilogical: No cervical lymphadenopathy. Cardiovascular: Normal rate, regular rhythm.  No murmurs, rubs, or gallops.  Respiratory: Normal respiratory effort without tachypnea nor retractions. Breath sounds are clear and equal bilaterally. No wheezes/rales/rhonchi. Gastrointestinal: Soft and tender to palpation in the ruq and epigastric region. No rebound. No guarding.  Genitourinary: Deferred Musculoskeletal: Normal range of motion in all extremities. No lower extremity edema. Neurologic:  Normal speech and language. No gross focal neurologic deficits are appreciated.  Skin:  Skin is warm, dry and intact. No rash noted. Psychiatric: Mood and affect are normal. Speech and behavior are normal. Patient exhibits appropriate insight and judgment.  ____________________________________________    LABS (pertinent positives/negatives)  Lipase 729 CBC wnl CMP k 3.3, glu 168, t bili 2.9  ____________________________________________   EKG  None  ____________________________________________    RADIOLOGY  RUQ [place] No gallstones, no ductal dilatation.  ____________________________________________   PROCEDURES  Procedures  ____________________________________________   INITIAL IMPRESSION / ASSESSMENT AND PLAN / ED COURSE  Pertinent labs & imaging results that were available during my care of the patient were reviewed by me and considered in my medical decision making (see chart for details).  Patient presented to the emergency department today because of concerns for abdominal pain, nausea and vomiting.  Differential would be broad including gastritis gastroenteritis hepatitis, pancreatitis, gallstone disease amongst other etiologies.  Patient's blood work did show elevated lipase.  Right upper quadrant ultrasound did not show any gallstones.  Patient did  feel better after IV fluids and medication however cannot tolerate p.o.  Will plan on admission.   ____________________________________________   FINAL CLINICAL IMPRESSION(S) / ED DIAGNOSES  Final diagnoses:  Epigastric abdominal pain  Acute pancreatitis, unspecified complication status, unspecified pancreatitis type     Note: This dictation was prepared with Dragon dictation. Any transcriptional errors that result from this process are unintentional     Phineas Semen, MD 10/29/17 2028

## 2017-10-29 NOTE — ED Notes (Signed)
Patient was incontinent of urine while coughing. Patient refused change of linens or new undergarments, but did agree to having a chux placed under her.

## 2017-10-29 NOTE — H&P (Signed)
[facility] [place] [facility]   PATIENT NAME: Kristen MannsLinda Dastrup    MR#:  [id number]  DATE OF BIRTH:  06/17/1961  DATE OF ADMISSION:  10/29/2017  PRIMARY CARE PHYSICIAN: Dortha KernBliss, Laura K, MD   REQUESTING/REFERRING PHYSICIAN: Phineas SemenGoodman, Graydon MD  CHIEF COMPLAINT:   Chief Complaint  Patient presents with  . Cough  . Abdominal Pain  . Emesis    HISTORY OF PRESENT ILLNESS: Kristen MannsLinda Masri  is a 57 y.o. female with a known history of   Asthma, depression, GERD and HTN, and alcohol abuse who is presenting to the emergency room with 3 days of abdominal pain.  Patient states that the abdominal pain is in center of the abdomen with radiation to the back.  She is also has nausea and vomiting and some diarrhea which is now resolved.  She states that she drinks about 6 beers per day which she has not drank in few days.  She also has had intermittent fevers but no chills.   PAST MEDICAL HISTORY:   Past Medical History:  Diagnosis Date  . Asthma   . Depression   . GERD (gastroesophageal reflux disease)   . Hypertension     PAST SURGICAL HISTORY:  Past Surgical History:  Procedure Laterality Date  . GASTRIC BYPASS    . ROTATOR CUFF REPAIR Right     SOCIAL HISTORY:  Social History   Tobacco Use  . Smoking status: Current Every Day Smoker    Packs/day: 0.50    Types: Cigarettes  . Smokeless tobacco: Never Used  Substance Use Topics  . Alcohol use: Yes    Alcohol/week: 8.4 oz    Types: 14 Cans of beer per week    FAMILY HISTORY:  Family History  Problem Relation Age of Onset  . Breast cancer Neg Hx     DRUG ALLERGIES:  Allergies  Allergen Reactions  . Percocet [Oxycodone-Acetaminophen] Anaphylaxis  . Vicodin [Hydrocodone-Acetaminophen] Itching    REVIEW OF SYSTEMS:   CONSTITUTIONAL: No fever, fatigue or weakness.  EYES: No blurred or double vision.  EARS, NOSE, AND THROAT: No tinnitus or ear pain.  RESPIRATORY: No cough, shortness of breath, wheezing or  hemoptysis.  CARDIOVASCULAR: No chest pain, orthopnea, edema.  GASTROINTESTINAL: No nausea, vomiting, diarrhea or positive abdominal pain.  GENITOURINARY: No dysuria, hematuria.  ENDOCRINE: No polyuria, nocturia,  HEMATOLOGY: No anemia, easy bruising or bleeding SKIN: No rash or lesion. MUSCULOSKELETAL: No joint pain or arthritis.   NEUROLOGIC: No tingling, numbness, weakness.  PSYCHIATRY: No anxiety or depression.   MEDICATIONS AT HOME:  Prior to Admission medications   Medication Sig Start Date End Date Taking? Authorizing Provider  albuterol (PROVENTIL HFA;VENTOLIN HFA) 108 (90 BASE) MCG/ACT inhaler Inhale 2 puffs into the lungs every 6 (six) hours as needed for wheezing or shortness of breath.    [provider]  cyclobenzaprine (FLEXERIL) 10 MG tablet Take 1 tablet (10 mg total) by mouth at bedtime. 01/02/15   Payton Mccallumonty, [place], MD  fluticasone (FLONASE) 50 MCG/ACT nasal spray Place 1 spray into both nostrils daily.    [provider]  ketorolac (TORADOL) 10 MG tablet Take 1 tablet (10 mg total) by mouth every 8 (eight) hours as needed. 01/02/15   Payton Mccallumonty, [place], MD  losartan-hydrochlorothiazide (HYZAAR) 100-25 MG per tablet Take 1 tablet by mouth daily.    [provider]  Multiple Vitamins-Minerals (MULTIVITAMIN ADULT PO) Take 1 tablet by mouth daily.    [provider]  omeprazole (PRILOSEC) 20 MG  capsule Take 20 mg by mouth 2 (two) times daily before a meal.    [provider]  venlafaxine (EFFEXOR) 75 MG tablet Take 75 mg by mouth 3 (three) times daily with meals.    [provider]  zolpidem (AMBIEN) 5 MG tablet Take 5 mg by mouth at bedtime as needed for sleep.    [provider]      PHYSICAL EXAMINATION:   VITAL SIGNS: Blood pressure (!) 156/94, pulse (!) 110, temperature 99.2 F (37.3 C), temperature source Oral, resp. rate 20, weight 188 lb (85.3 kg), SpO2 96 %.  GENERAL:  57 y.o.-year-old patient lying in the  bed with no acute distress.  EYES: Pupils equal, round, reactive to light and accommodation. No scleral icterus. Extraocular muscles intact.  HEENT: Head atraumatic, normocephalic. Oropharynx and nasopharynx clear.  NECK:  Supple, no jugular venous distention. No thyroid enlargement, no tenderness.  LUNGS: Normal breath sounds bilaterally, no wheezing, rales,rhonchi or crepitation. No use of accessory muscles of respiration.  CARDIOVASCULAR: S1, S2 normal. No murmurs, rubs, or gallops.  ABDOMEN: Soft, epigastric tender nondistended. Bowel sounds present. No organomegaly or mass.  EXTREMITIES: No pedal edema, cyanosis, or clubbing.  NEUROLOGIC: Cranial nerves II through XII are intact. Muscle strength 5/5 in all extremities. Sensation intact. Gait not checked.  PSYCHIATRIC: The patient is alert and oriented x 3.  SKIN: No obvious rash, lesion, or ulcer.   LABORATORY PANEL:   CBC Recent Labs  Lab 10/29/17 1340  WBC 10.5  HGB 14.0  HCT 42.2  PLT 263  MCV 87.4  MCH 29.0  MCHC 33.2  RDW 14.5   ------------------------------------------------------------------------------------------------------------------  Chemistries  Recent Labs  Lab 10/29/17 1340  NA 138  K 3.3*  CL 101  CO2 22  GLUCOSE 168*  BUN 11  CREATININE 0.80  CALCIUM 9.1  AST 49*  ALT 33  ALKPHOS 118  BILITOT 2.9*   ------------------------------------------------------------------------------------------------------------------ CrCl cannot be calculated (Unknown ideal weight.). ------------------------------------------------------------------------------------------------------------------ No results for input(s): TSH, T4TOTAL, T3FREE, THYROIDAB in the last 72 hours.  Invalid input(s): FREET3   Coagulation profile No results for input(s): INR, PROTIME in the last 168 hours. ------------------------------------------------------------------------------------------------------------------- No results  for input(s): DDIMER in the last 72 hours. -------------------------------------------------------------------------------------------------------------------  Cardiac Enzymes No results for input(s): CKMB, TROPONINI, MYOGLOBIN in the last 168 hours.  Invalid input(s): CK ------------------------------------------------------------------------------------------------------------------ Invalid input(s): POCBNP  ---------------------------------------------------------------------------------------------------------------  Urinalysis    Component Value Date/Time   COLORURINE AMBER (A) 10/29/2017 1633   APPEARANCEUR HAZY (A) 10/29/2017 1633   LABSPEC 1.027 10/29/2017 1633   PHURINE 5.0 10/29/2017 1633   GLUCOSEU NEGATIVE 10/29/2017 1633   HGBUR SMALL (A) 10/29/2017 1633   BILIRUBINUR NEGATIVE 10/29/2017 1633   KETONESUR 80 (A) 10/29/2017 1633   PROTEINUR 100 (A) 10/29/2017 1633   NITRITE NEGATIVE 10/29/2017 1633   LEUKOCYTESUR NEGATIVE 10/29/2017 1633     RADIOLOGY: US Abdomen Limited Ruq  Result Date: 10/29/2017 CLINICAL DATA:  Epigastric pain for 1 day EXAM: ULTRASOUND ABDOMEN LIMITED RIGHT UPPER QUADRANT COMPARISON:  None. FINDINGS: Gallbladder: No gallstones or wall thickening visualized. No sonographic Murphy sign noted by sonographer. Common bile duct: Diameter: 3.7 mm Liver: Mild increased echogenicity is noted without focal mass. Portal vein is patent on color Doppler imaging with normal direction of blood flow towards the liver. IMPRESSION: Fatty liver. No other focal abnormality is seen. Electronically Signed   By: Alcide Clever M.D.   On: 10/29/2017 17:41    EKG: Orders placed or performed during the hospital encounter  of 09/23/16  . EKG 12-Lead  . EKG 12-Lead  . ED EKG within 10 minutes  . ED EKG within 10 minutes  . EKG    IMPRESSION AND PLAN: Patient is 57 year old African-American female with history of alcohol abuse presenting with abdominal pain  1.  Acute  pancreatitis due to alcohol Keep n.p.o. except meds IV fluids Stop HCTZ Check lipid panel in the a.m.  2.  Alcohol abuse Placed on CIWA protocol  3.  Essential hypertension   continue therapy  losartan HCTZ has been discontinued Use IV as needed medications as needed  4.  GERD we will place her on IV Protonix  5.  Nicotine abuse smoking cessation provided 4 4 minutes spent Offered nicotine patch patient does not want a nicotine patch    All the records are reviewed and case discussed with ED provider. Management plans discussed with the patient, family and they are in agreement.  CODE STATUS: Code Status History    This patient does not have a recorded code status. Please follow your organizational policy for patients in this situation.       TOTAL TIME TAKING CARE OF THIS PATIENT:55 minutes.    Auburn Bilberry M.D on 10/29/2017 at 8:15 PM  Between 7am to 6pm - Pager - [phone]  After 6pm go to [email or web address] - password [email or web address]  [facility] Office  [phone]  CC: Primary care physician; Dortha Kern, MD

## 2017-10-29 NOTE — ED Triage Notes (Signed)
Pt to ed via ems from home with c/o upper abd pain, cough, and vomiting.  Pt states she has vomited x 4 in last 24 hours. Pt also states "my lips are dry"

## 2017-10-30 LAB — CBC
HEMATOCRIT: 37.6 % (ref 35.0–47.0)
HEMOGLOBIN: 12.6 g/dL (ref 12.0–16.0)
MCH: 29.1 pg (ref 26.0–34.0)
MCHC: 33.5 g/dL (ref 32.0–36.0)
MCV: 86.7 fL (ref 80.0–100.0)
Platelets: 182 10*3/uL (ref 150–440)
RBC: 4.34 MIL/uL (ref 3.80–5.20)
RDW: 14.5 % (ref 11.5–14.5)
WBC: 9.6 10*3/uL (ref 3.6–11.0)

## 2017-10-30 LAB — BASIC METABOLIC PANEL
Anion gap: 11 (ref 5–15)
BUN: 10 mg/dL (ref 6–20)
CHLORIDE: 105 mmol/L (ref 101–111)
CO2: 23 mmol/L (ref 22–32)
CREATININE: 0.58 mg/dL (ref 0.44–1.00)
Calcium: 8.3 mg/dL — ABNORMAL LOW (ref 8.9–10.3)
GFR calc non Af Amer: >60 mL/min (ref >60)
Glucose, Bld: 112 mg/dL — ABNORMAL HIGH (ref 65–99)
Potassium: 3 mmol/L — ABNORMAL LOW (ref 3.5–5.1)
SODIUM: 139 mmol/L (ref 135–145)

## 2017-10-30 NOTE — Progress Notes (Signed)
[facility] - [place] [facility]   PATIENT NAME: Kristen Velasquez    MR#:  [id number]  DATE OF BIRTH:  08/27/1960  SUBJECTIVE:  Came in after having significant abdominal pain for last couple days.  Patient drinks beer on a daily basis.  No nausea vomiting  REVIEW OF SYSTEMS:   Review of Systems  Constitutional: Negative for chills, fever and weight loss.  HENT: Negative for ear discharge, ear pain and nosebleeds.   Eyes: Negative for blurred vision, pain and discharge.  Respiratory: Negative for sputum production, shortness of breath, wheezing and stridor.   Cardiovascular: Negative for chest pain, palpitations, orthopnea and PND.  Gastrointestinal: Positive for abdominal pain. Negative for diarrhea, nausea and vomiting.  Genitourinary: Negative for frequency and urgency.  Musculoskeletal: Negative for back pain and joint pain.  Neurological: Positive for weakness. Negative for sensory change, speech change and focal weakness.  Psychiatric/Behavioral: Negative for depression and hallucinations. The patient is not nervous/anxious.    Tolerating Diet: N.p.o. Tolerating PT: Not needed  DRUG ALLERGIES:   Allergies  Allergen Reactions  . Percocet [Oxycodone-Acetaminophen] Anaphylaxis  . Vicodin [Hydrocodone-Acetaminophen] Itching    VITALS:  Blood pressure (!) 143/73, pulse 90, temperature 98.9 F (37.2 C), temperature source Oral, resp. rate 20, height 5\' 1"  (1.549 m), weight 82 kg (180 lb 12.8 oz), SpO2 92 %.  PHYSICAL EXAMINATION:   Physical Exam  GENERAL:  [age] y.o.-year-old patient lying in the bed with no acute distress.  EYES: Pupils equal, round, reactive to light and accommodation. No scleral icterus. Extraocular muscles intact.  HEENT: Head atraumatic, normocephalic. Oropharynx and nasopharynx clear.  NECK:  Supple, no jugular venous distention. No thyroid enlargement, no tenderness.  LUNGS: Normal breath sounds bilaterally, no wheezing,  rales, rhonchi. No use of accessory muscles of respiration.  CARDIOVASCULAR: S1, S2 normal. No murmurs, rubs, or gallops.  ABDOMEN: Soft, nontender, nondistended. Bowel sounds present. No organomegaly or mass.  EXTREMITIES: No cyanosis, clubbing or edema b/l.    NEUROLOGIC: Cranial nerves II through XII are intact. No focal Motor or sensory deficits b/l.   PSYCHIATRIC:  patient is alert and oriented x 3.  SKIN: No obvious rash, lesion, or ulcer.   LABORATORY PANEL:  CBC Recent Labs  Lab 10/30/17 0419  WBC 9.6  HGB 12.6  HCT 37.6  PLT 182    Chemistries  Recent Labs  Lab 10/29/17 1340 10/30/17 0419  NA 138 139  K 3.3* 3.0*  CL 101 105  CO2 22 23  GLUCOSE 168* 112*  BUN 11 10  CREATININE 0.80 0.58  CALCIUM 9.1 8.3*  AST 49*  --   ALT 33  --   ALKPHOS 118  --   BILITOT 2.9*  --    Cardiac Enzymes No results for input(s): TROPONINI in the last 168 hours. RADIOLOGY:  [place] Abdomen Limited Ruq  Result Date: 10/29/2017 CLINICAL DATA:  Epigastric pain for 1 day EXAM: ULTRASOUND ABDOMEN LIMITED RIGHT UPPER QUADRANT COMPARISON:  None. FINDINGS: Gallbladder: No gallstones or wall thickening visualized. No sonographic Murphy sign noted by sonographer. Common bile duct: Diameter: 3.7 mm Liver: Mild increased echogenicity is noted without focal mass. Portal vein is patent on color Doppler imaging with normal direction of blood flow towards the liver. IMPRESSION: Fatty liver. No other focal abnormality is seen. Electronically Signed   By: Alcide CleverMark  Lukens M.D.   On: 10/29/2017 17:41   ASSESSMENT AND PLAN:  Kristen Velasquez  is a 57 y.o. female with a known  history of  Asthma, depression, GERD and HTN, and alcohol abuse who is presenting to the emergency room with 3 days of abdominal pain.  Patient states that the abdominal pain is in center of the abdomen with radiation to the back.  She is also has nausea and vomiting and some diarrhea which is now resolved.  She states that she drinks about 6  beers per day which she has not drank in few days  1.  Acute pancreatitis suspected due to alcohol -Lipase 700--- repeat in a.m. -Continue aggressive IV fluids -PRN pain meds -DC HCTZ  2.  Alcohol abuse Monitor for signs of withdrawal  3.  Hypertension Continue losartan  4 DVT prophylaxis subcu Lovenox  Case discussed with Care Management/Social Worker. Management plans discussed with the patient, family and they are in agreement.  CODE STATUS: Full  DVT Prophylaxis:Lovenox  TOTAL TIME TAKING CARE OF THIS PATIENT: *30* minutes.  >50% time spent on counselling and coordination of care  POSSIBLE D/C IN 1-2 DAYS, DEPENDING ON CLINICAL CONDITION.  Note: This dictation was prepared with Dragon dictation along with smaller phrase technology. Any transcriptional errors that result from this process are unintentional.  Enedina Finner M.D on 10/30/2017 at 2:12 PM  Between 7am to 6pm - Pager - [phone]  After 6pm go to [email or web address] - Social research officer, government  [facility]  Office  [phone]  CC: Primary care physician; Dortha Kern, MDPatient ID: Kristen Velasquez, female   DOB: [birth year], 59 y.o.   MRN: [id number]

## 2017-10-31 MED ORDER — POTASSIUM CHLORIDE 10 MEQ/100ML IV SOLN
10.0000 meq | INTRAVENOUS | Status: AC
  Administered 2017-10-31 (×4): 10 meq via INTRAVENOUS
  Filled 2017-10-31 (×4): qty 100

## 2017-10-31 NOTE — Progress Notes (Signed)
Pt was given D/C instructions and told she had no changes to her med list and alerted that if symptoms become worse again to come back to the hospital or call PCP. I removed her IV without incident. I took her to be released to her husband.

## 2017-10-31 NOTE — Discharge Summary (Signed)
[facility] [place] at [facility]   PATIENT NAME: Kristen Velasquez    MR#:  [id number]  DATE OF BIRTH:  03/07/1961  DATE OF ADMISSION:  10/29/2017 ADMITTING PHYSICIAN: Auburn Bilberry, MD  DATE OF DISCHARGE: 10/31/2017  PRIMARY CARE PHYSICIAN: Dortha Kern, MD    ADMISSION DIAGNOSIS:  Epigastric abdominal pain [R10.13] Acute pancreatitis, unspecified complication status, unspecified pancreatitis type [K85.90]  DISCHARGE DIAGNOSIS:  Active Problems:   Acute pancreatitis   SECONDARY DIAGNOSIS:   Past Medical History:  Diagnosis Date  . Asthma   . Depression   . GERD (gastroesophageal reflux disease)   . Hypertension     HOSPITAL COURSE:   57 year old female with a history of EtOH abuse who presents with abdominal pain.  1. Acute pancreatitis: Etiology of pancreatitis is due to EtOH abuse. Abdominal pain has subsided. She is tolerating diet.  2. EtOH abuse: Patient was counseled on stopping EtOH.  3.Tobacco dependence: Patient is encouraged to quit smoking. Counseling was provided for 4 minutes.  4. Depression: Continue Effexor     DISCHARGE CONDITIONS AND DIET:  Stable for discharge on regular diet  CONSULTS OBTAINED:    DRUG ALLERGIES:   Allergies  Allergen Reactions  . Percocet [Oxycodone-Acetaminophen] Anaphylaxis  . Vicodin [Hydrocodone-Acetaminophen] Itching    DISCHARGE MEDICATIONS:   Allergies as of 10/31/2017      Reactions   Percocet [oxycodone-acetaminophen] Anaphylaxis   Vicodin [hydrocodone-acetaminophen] Itching      Medication List    TAKE these medications   albuterol 108 (90 Base) MCG/ACT inhaler Commonly known as:  PROVENTIL HFA;VENTOLIN HFA Inhale 2 puffs into the lungs every 6 (six) hours as needed for wheezing or shortness of breath.   cyclobenzaprine 10 MG tablet Commonly known as:  FLEXERIL Take 1 tablet (10 mg total) by mouth at bedtime.   fluticasone 50 MCG/ACT nasal spray Commonly known as:   FLONASE Place 1 spray into both nostrils daily.   ketorolac 10 MG tablet Commonly known as:  TORADOL Take 1 tablet (10 mg total) by mouth every 8 (eight) hours as needed.   losartan-hydrochlorothiazide 100-25 MG tablet Commonly known as:  HYZAAR Take 1 tablet by mouth daily.   MULTIVITAMIN ADULT PO Take 1 tablet by mouth daily.   omeprazole 20 MG capsule Commonly known as:  PRILOSEC Take 20 mg by mouth 2 (two) times daily before a meal.   venlafaxine 75 MG tablet Commonly known as:  EFFEXOR Take 75 mg by mouth 3 (three) times daily with meals.   zolpidem 5 MG tablet Commonly known as:  AMBIEN Take 5 mg by mouth at bedtime as needed for sleep.         Today   CHIEF COMPLAINT:   Patient without abdominal pain. Reports that she is very hungry.   VITAL SIGNS:  Blood pressure 133/75, pulse 81, temperature (!) 97.5 F (36.4 C), temperature source Oral, resp. rate 20, height 5\' 1"  (1.549 m), weight 82 kg (180 lb 12.8 oz), SpO2 100 %.   REVIEW OF SYSTEMS:  Review of Systems  Constitutional: Negative.  Negative for chills, fever and malaise/fatigue.  HENT: Negative.  Negative for ear discharge, ear pain, hearing loss, nosebleeds and sore throat.   Eyes: Negative.  Negative for blurred vision and pain.  Respiratory: Negative.  Negative for cough, hemoptysis, shortness of breath and wheezing.   Cardiovascular: Negative.  Negative for chest pain, palpitations and leg swelling.  Gastrointestinal: Negative.  Negative for abdominal pain, blood in stool, diarrhea, nausea  and vomiting.  Genitourinary: Negative.  Negative for dysuria.  Musculoskeletal: Negative.  Negative for back pain.  Skin: Negative.   Neurological: Negative for dizziness, tremors, speech change, focal weakness, seizures and headaches.  Endo/Heme/Allergies: Negative.  Does not bruise/bleed easily.  Psychiatric/Behavioral: Negative.  Negative for depression, hallucinations and suicidal ideas.     PHYSICAL  EXAMINATION:  GENERAL:  57 y.o.-year-old patient lying in the bed with no acute distress.  NECK:  Supple, no jugular venous distention. No thyroid enlargement, no tenderness.  LUNGS: Normal breath sounds bilaterally, no wheezing, rales,rhonchi  No use of accessory muscles of respiration.  CARDIOVASCULAR: S1, S2 normal. No murmurs, rubs, or gallops.  ABDOMEN: Soft, non-tender, non-distended. Bowel sounds present. No organomegaly or mass.  EXTREMITIES: No pedal edema, cyanosis, or clubbing.  PSYCHIATRIC: The patient is alert and oriented x 3.  SKIN: No obvious rash, lesion, or ulcer.   DATA REVIEW:   CBC Recent Labs  Lab 10/30/17 0419  WBC 9.6  HGB 12.6  HCT 37.6  PLT 182    Chemistries  Recent Labs  Lab 10/29/17 1340 10/30/17 0419  NA 138 139  K 3.3* 3.0*  CL 101 105  CO2 22 23  GLUCOSE 168* 112*  BUN 11 10  CREATININE 0.80 0.58  CALCIUM 9.1 8.3*  AST 49*  --   ALT 33  --   ALKPHOS 118  --   BILITOT 2.9*  --     Cardiac Enzymes No results for input(s): TROPONINI in the last 168 hours.  Microbiology Results  @MICRORSLT48 @  RADIOLOGY:  [place] Abdomen Limited Ruq  Result Date: 10/29/2017 CLINICAL DATA:  Epigastric pain for 1 day EXAM: ULTRASOUND ABDOMEN LIMITED RIGHT UPPER QUADRANT COMPARISON:  None. FINDINGS: Gallbladder: No gallstones or wall thickening visualized. No sonographic Murphy sign noted by sonographer. Common bile duct: Diameter: 3.7 mm Liver: Mild increased echogenicity is noted without focal mass. Portal vein is patent on color Doppler imaging with normal direction of blood flow towards the liver. IMPRESSION: Fatty liver. No other focal abnormality is seen. Electronically Signed   By: Alcide CleverMark  Lukens M.D.   On: 10/29/2017 17:41      Allergies as of 10/31/2017      Reactions   Percocet [oxycodone-acetaminophen] Anaphylaxis   Vicodin [hydrocodone-acetaminophen] Itching      Medication List    TAKE these medications   albuterol 108 (90 Base) MCG/ACT  inhaler Commonly known as:  PROVENTIL HFA;VENTOLIN HFA Inhale 2 puffs into the lungs every 6 (six) hours as needed for wheezing or shortness of breath.   cyclobenzaprine 10 MG tablet Commonly known as:  FLEXERIL Take 1 tablet (10 mg total) by mouth at bedtime.   fluticasone 50 MCG/ACT nasal spray Commonly known as:  FLONASE Place 1 spray into both nostrils daily.   ketorolac 10 MG tablet Commonly known as:  TORADOL Take 1 tablet (10 mg total) by mouth every 8 (eight) hours as needed.   losartan-hydrochlorothiazide 100-25 MG tablet Commonly known as:  HYZAAR Take 1 tablet by mouth daily.   MULTIVITAMIN ADULT PO Take 1 tablet by mouth daily.   omeprazole 20 MG capsule Commonly known as:  PRILOSEC Take 20 mg by mouth 2 (two) times daily before a meal.   venlafaxine 75 MG tablet Commonly known as:  EFFEXOR Take 75 mg by mouth 3 (three) times daily with meals.   zolpidem 5 MG tablet Commonly known as:  AMBIEN Take 5 mg by mouth at bedtime as needed for sleep.  Management plans discussed with the patient and she is in agreement. Stable for discharge home  Patient should follow up with pcp  CODE STATUS:     Code Status Orders  (From admission, onward)        Start     Ordered   10/29/17 2311  Full code  Continuous     10/29/17 2311    Code Status History    Date Active Date Inactive Code Status Order ID Comments User Context   This patient has a current code status but no historical code status.      TOTAL TIME TAKING CARE OF THIS PATIENT: 37 minutes.    Note: This dictation was prepared with Dragon dictation along with smaller phrase technology. Any transcriptional errors that result from this process are unintentional.  Fayetta Sorenson M.D on 10/31/2017 at 11:45 AM  Between 7am to 6pm - Pager - [phone] After 6pm go to [email or web address] - password [email or web address]  [facility]  Office  [phone]  CC: Primary care physician; Dortha Kern, MD

## 2017-11-16 ENCOUNTER — Other Ambulatory Visit: Payer: Self-pay | Admitting: Family Medicine

## 2017-11-16 ENCOUNTER — Ambulatory Visit
Admission: RE | Admit: 2017-11-16 | Discharge: 2017-11-16 | Disposition: A | Discharge status: Home / Self Care | Payer: BLUE CROSS/BLUE SHIELD | Source: Ambulatory Visit | Attending: Family Medicine | Admitting: Family Medicine

## 2017-11-16 ENCOUNTER — Encounter (INDEPENDENT_AMBULATORY_CARE_PROVIDER_SITE_OTHER): Payer: Self-pay

## 2017-11-16 DIAGNOSIS — Z111 Encounter for screening for respiratory tuberculosis: Secondary | ICD-10-CM

## 2017-11-16 DIAGNOSIS — R7611 Nonspecific reaction to tuberculin skin test without active tuberculosis: Secondary | ICD-10-CM

## 2018-01-09 IMAGING — CR DG CHEST 2V
1 series · 2 of 2 positions shown · non-contrast
Comparison: 09/13/2009

CLINICAL DATA: Central chest pain and dyspnea for 2 months.

EXAM:
CHEST  2 VIEW

[Series 1: dg chest 2 view · 0.14mm/px · 2 of 2 slices shown]
[im 1/2]
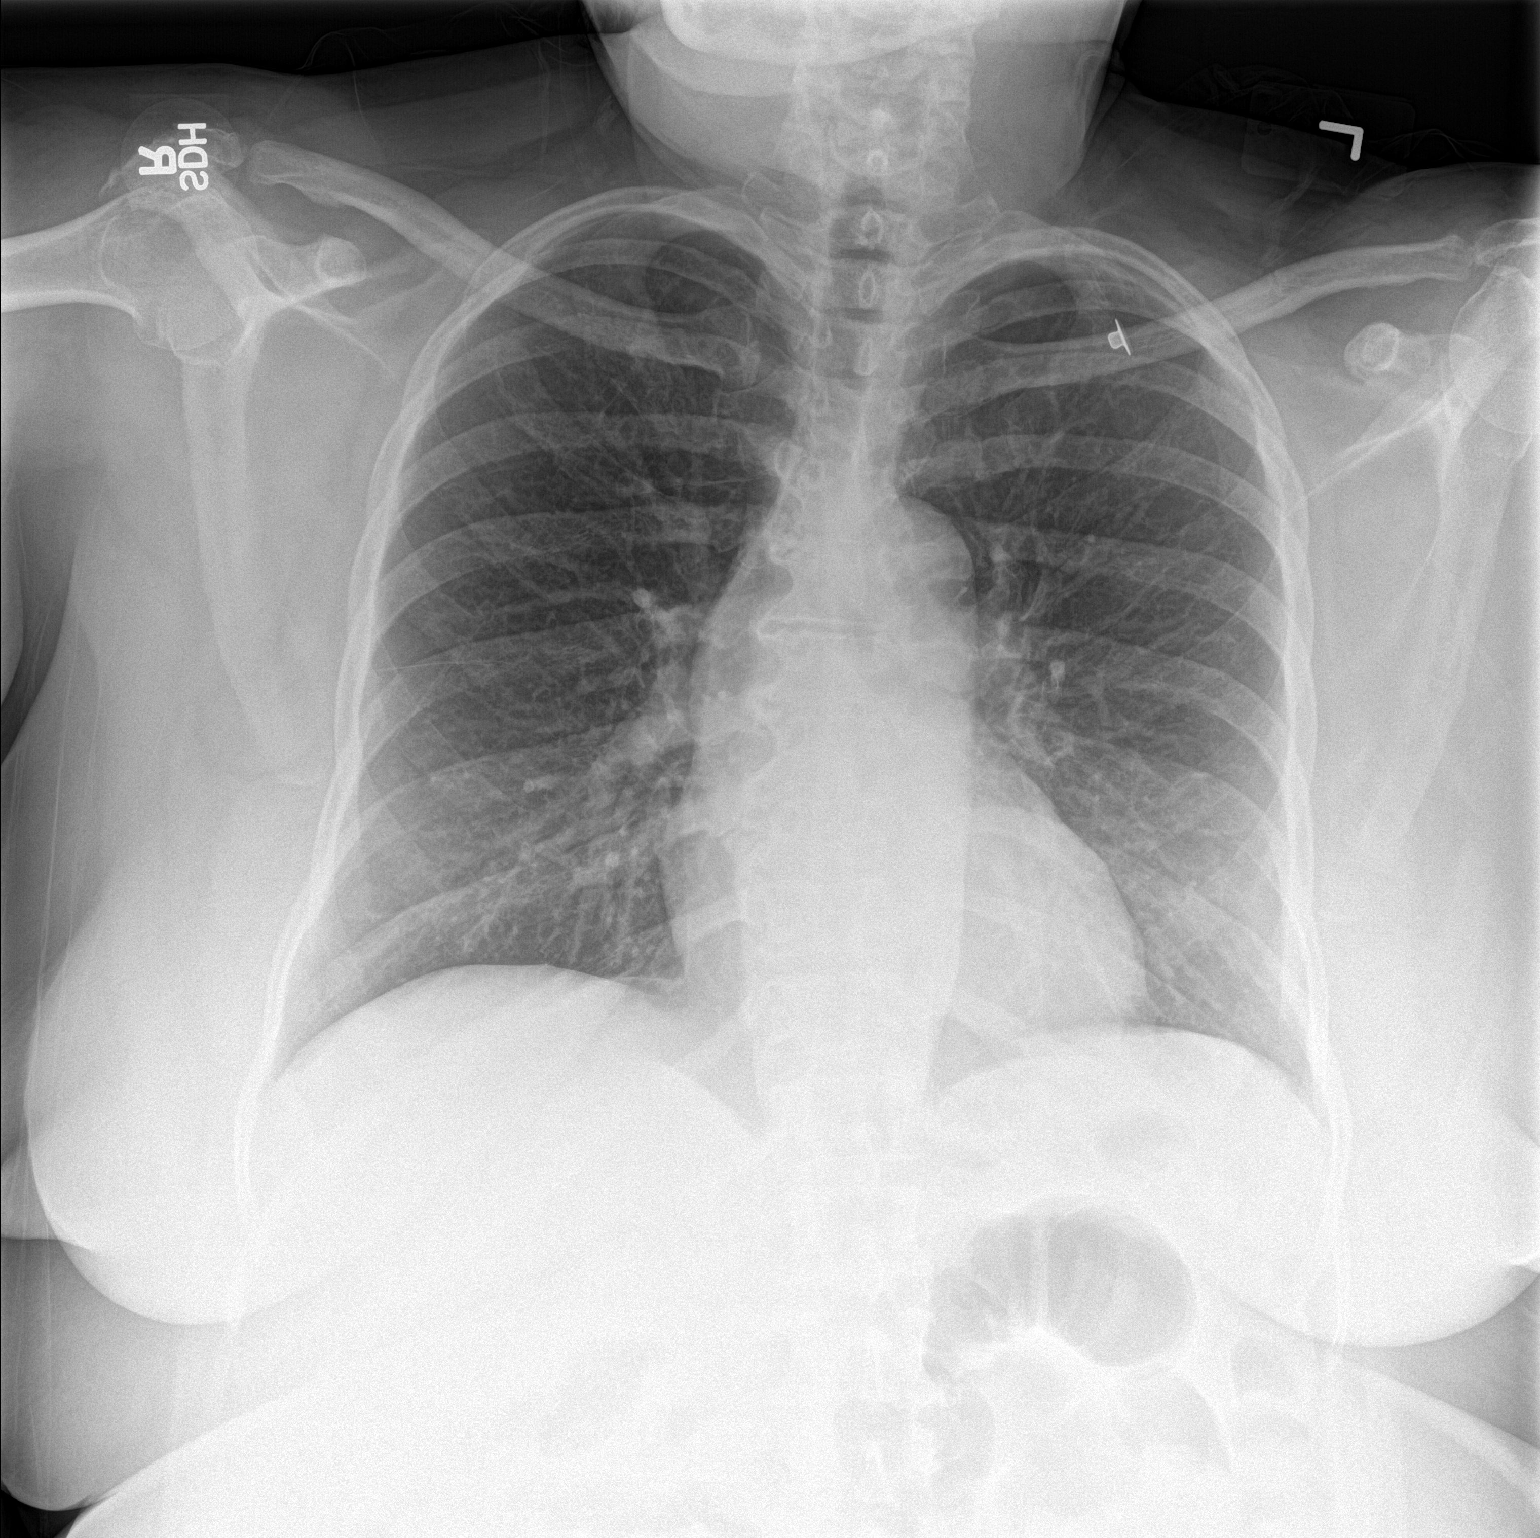
[im 2/2]
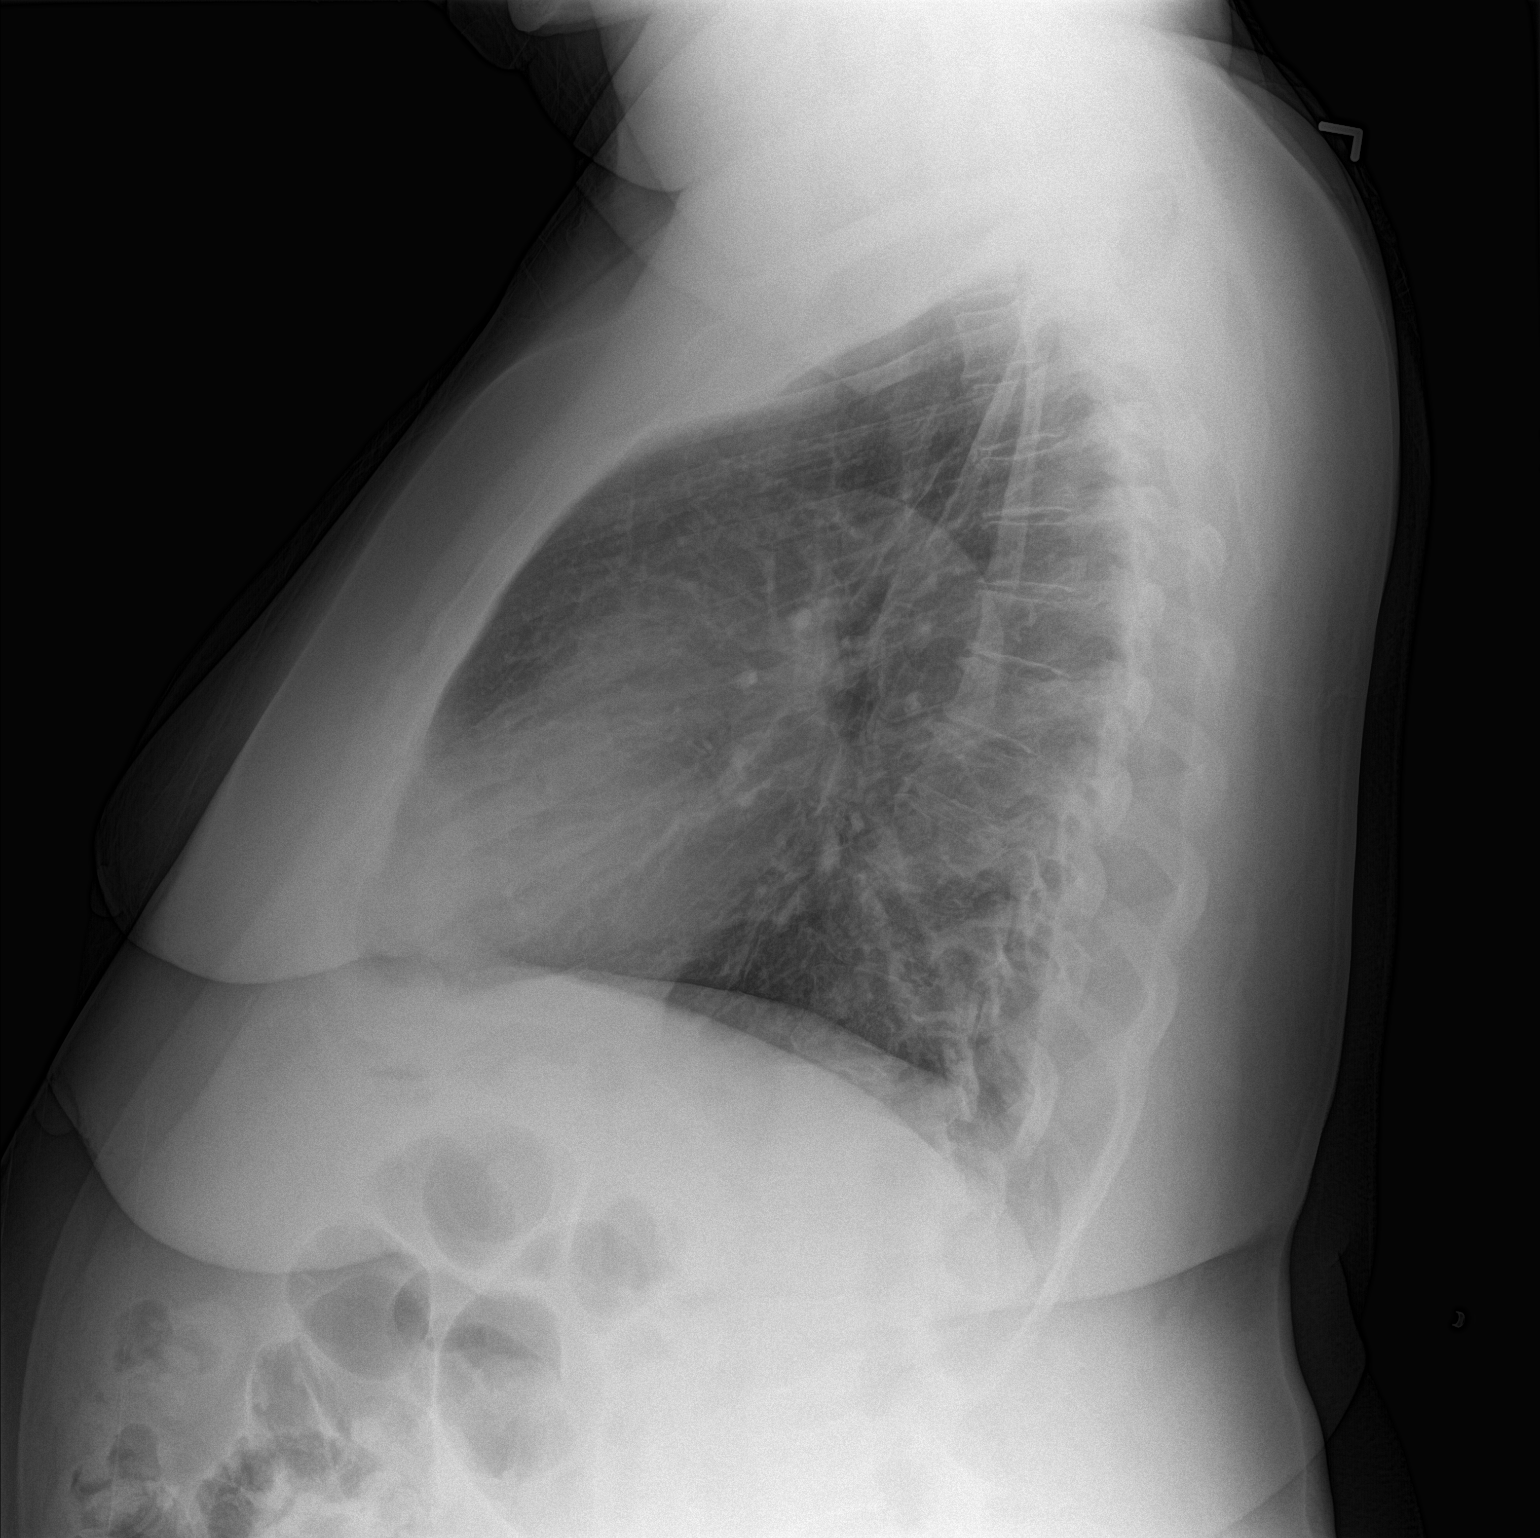

[2 of 2 positions shown; findings below may reference images not displayed]

FINDINGS: The heart size and mediastinal contours are within normal limits.
Both lungs are clear. The visualized skeletal structures are
unremarkable.
IMPRESSION: No active cardiopulmonary disease.

## 2018-09-08 IMAGING — US US ABDOMEN LIMITED
1 series · 14 of 25 positions shown · non-contrast
Comparison: None.

CLINICAL DATA: Epigastric pain for 1 day

EXAM:
ULTRASOUND ABDOMEN LIMITED RIGHT UPPER QUADRANT

[Series 1: us abdomen limited · 0.23mm/px · 14 of 41 slices shown]
[im 1/41]
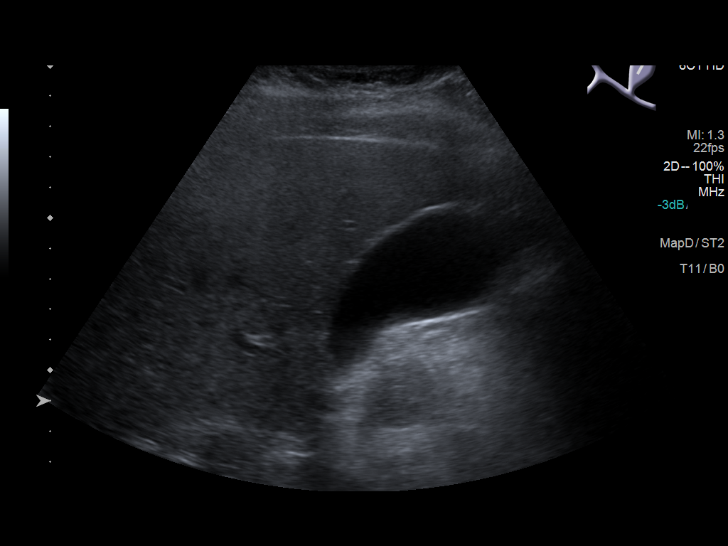
[im 4/41]
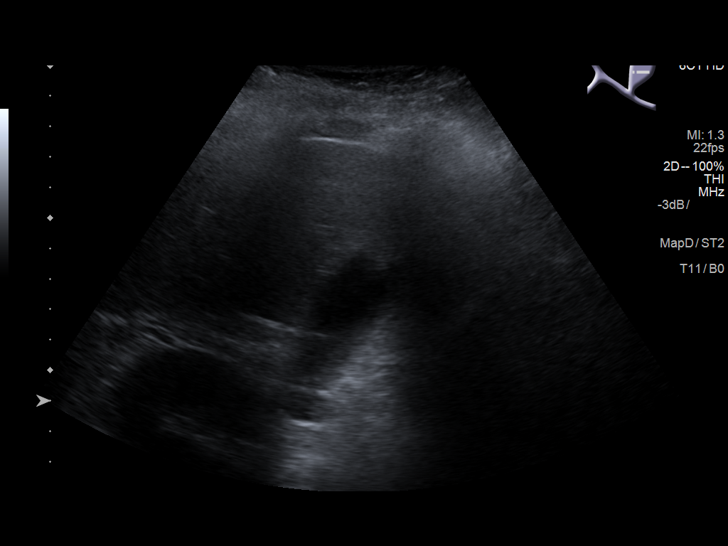
[im 7/41]
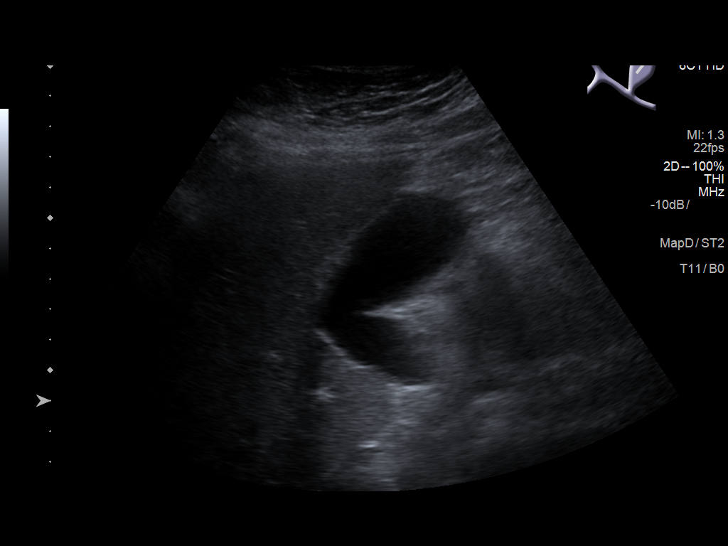
[im 11/41]
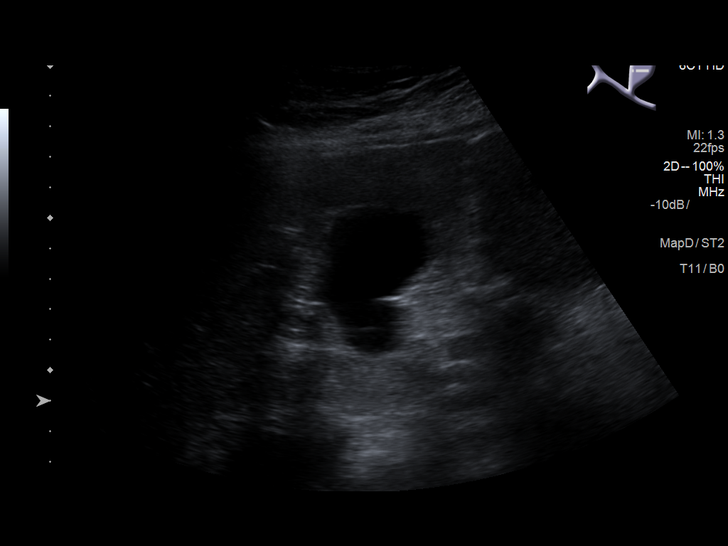
[im 14/41]
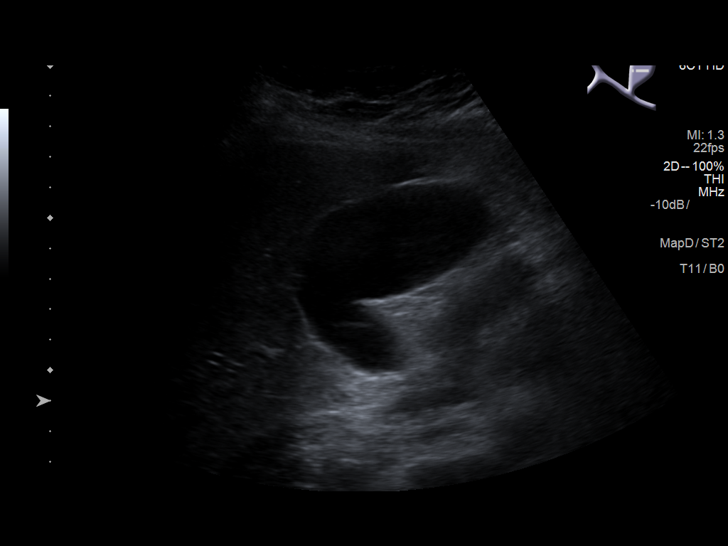
[im 16/41]
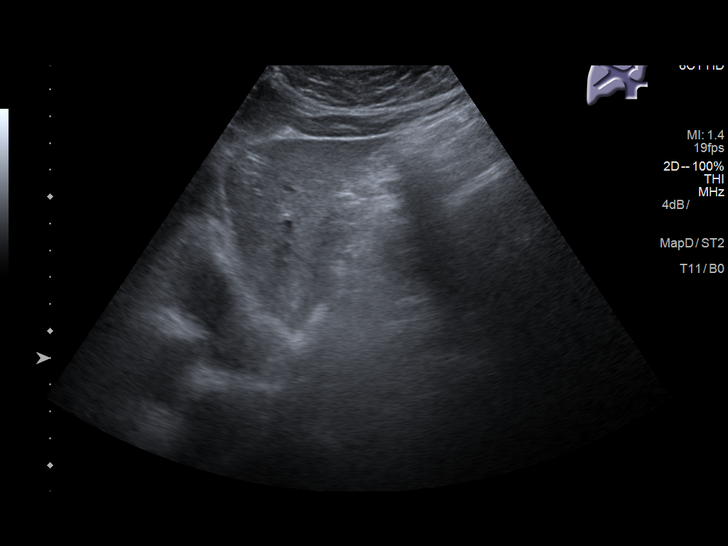
[im 19/41]
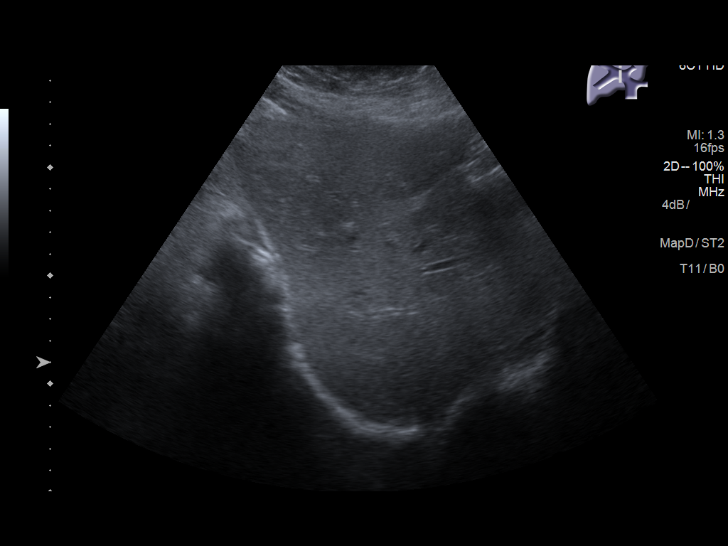
[im 22/41]
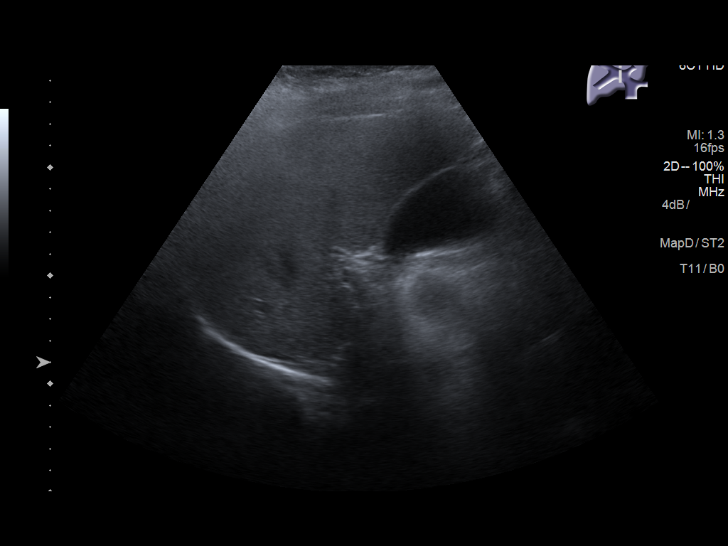
[im 26/41]
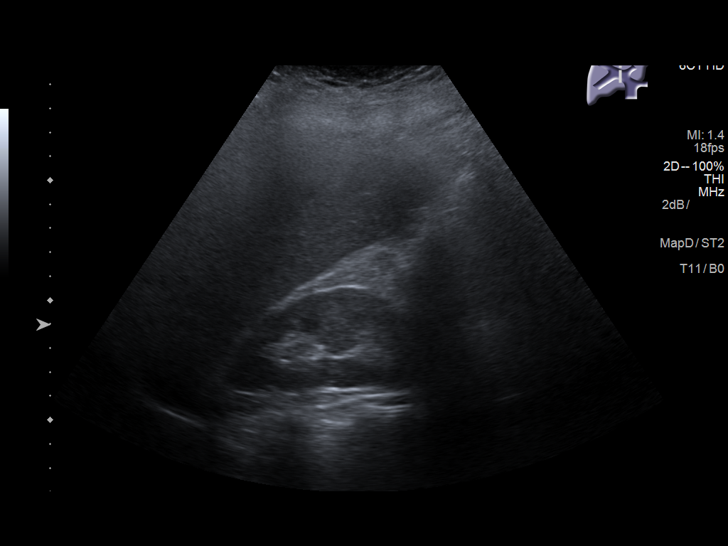
[im 27/41]
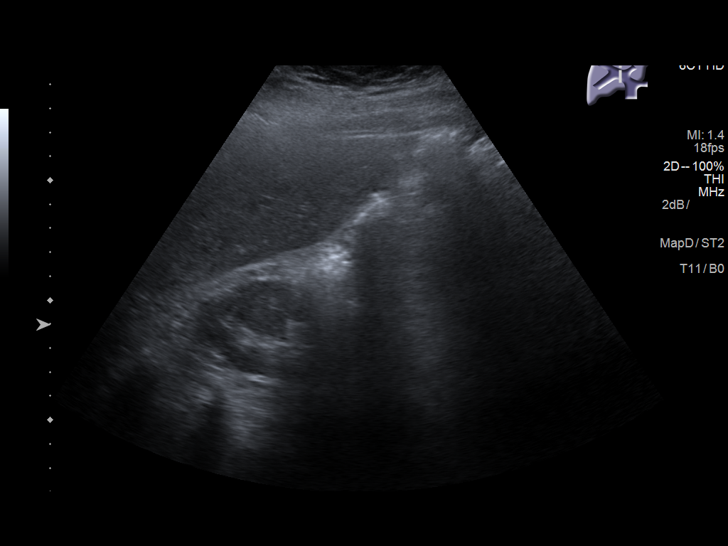
[im 31/41]
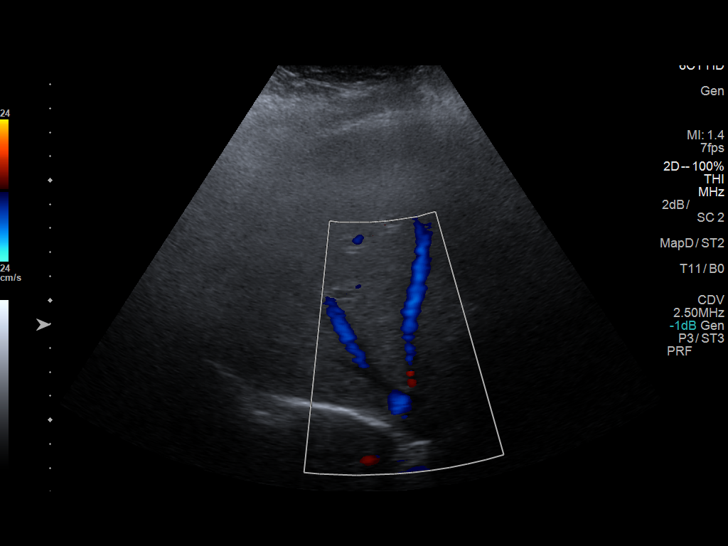
[im 34/41]
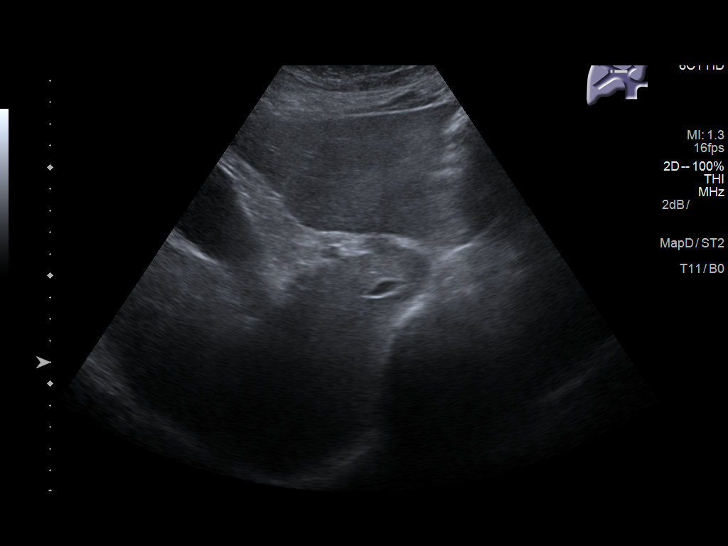
[im 37/41]
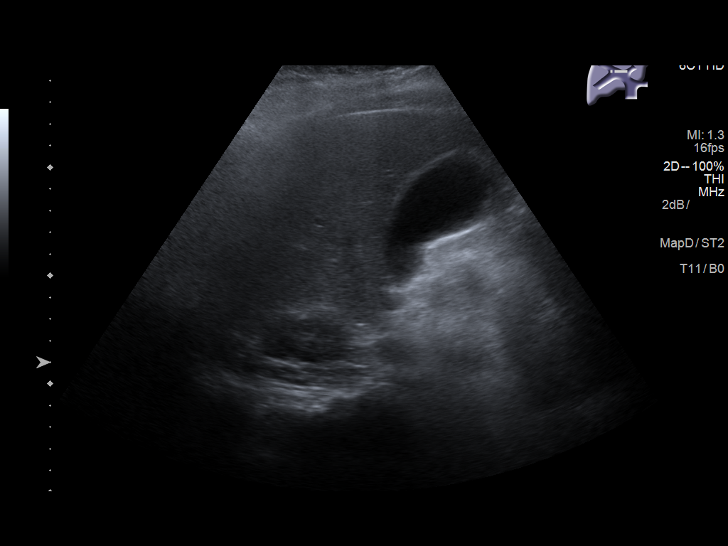
[im 41/41]
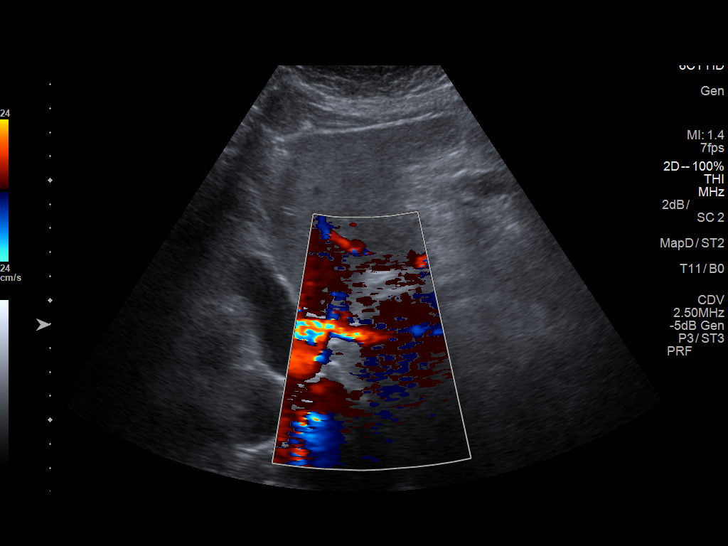

[14 of 25 positions shown; findings below may reference images not displayed]

FINDINGS: Gallbladder:

No gallstones or wall thickening visualized. No sonographic Murphy
sign noted by sonographer.

Common bile duct:

Diameter: 3.7 mm

Liver:

Mild increased echogenicity is noted without focal mass. Portal vein
is patent on color Doppler imaging with normal direction of blood
flow towards the liver.
IMPRESSION: Fatty liver.

No other focal abnormality is seen.

## 2018-09-28 ENCOUNTER — Other Ambulatory Visit: Payer: Self-pay | Admitting: Family Medicine

## 2018-09-28 DIAGNOSIS — Z1231 Encounter for screening mammogram for malignant neoplasm of breast: Secondary | ICD-10-CM

## 2018-10-11 ENCOUNTER — Ambulatory Visit: Payer: BLUE CROSS/BLUE SHIELD

## 2018-10-19 ENCOUNTER — Encounter: Payer: Self-pay | Admitting: Emergency Medicine

## 2018-10-19 ENCOUNTER — Other Ambulatory Visit: Payer: Self-pay

## 2018-10-19 ENCOUNTER — Ambulatory Visit
Admission: EM | Admit: 2018-10-19 | Discharge: 2018-10-19 | Disposition: A | Discharge status: Home / Self Care | Payer: BLUE CROSS/BLUE SHIELD | Attending: Family Medicine | Admitting: Family Medicine

## 2018-10-19 DIAGNOSIS — M5431 Sciatica, right side: Secondary | ICD-10-CM

## 2018-10-19 MED ORDER — MELOXICAM 7.5 MG PO TABS: 7.5000 mg | ORAL_TABLET | Freq: Every day | ORAL | 0 refills | Status: DC

## 2018-10-19 MED ORDER — KETOROLAC TROMETHAMINE 60 MG/2ML IM SOLN
60.0000 mg | Freq: Once | INTRAMUSCULAR | Status: AC
  Administered 2018-10-19: 60 mg via INTRAMUSCULAR

## 2018-10-19 MED ORDER — LIDOCAINE 5 % EX PTCH: 1.0000 | MEDICATED_PATCH | CUTANEOUS | 0 refills | Status: DC

## 2018-10-19 MED ORDER — METHOCARBAMOL 750 MG PO TABS
750.0000 mg | ORAL_TABLET | Freq: Three times a day (TID) | ORAL | 0 refills | Status: AC
Start: 2018-10-19 — End: ?

## 2018-10-19 MED ORDER — KETOROLAC TROMETHAMINE 60 MG/2ML IM SOLN: 60.0000 mg | Freq: Once | INTRAMUSCULAR | Status: DC

## 2018-10-19 NOTE — ED Triage Notes (Signed)
Pt c/o muscle spasms in her back and down her right leg. Started last night. She normally takes methocarbamol for this and she is out.

## 2018-10-19 NOTE — ED Provider Notes (Signed)
[facility]    CSN: [id number] Arrival date & time: 10/19/18  0102     History   Chief Complaint Chief Complaint  Patient presents with  . Spasms  . Back Pain    HPI Kristen Velasquez is a 58 y.o. female.   HPI  58 year old female presents complaning of pain in her low back indicating the right sacroiliac area with extension into her buttock and down the posterior thigh to the level of her knee.  States that they feel like spasms.  Started last night.  She recently was at the funeral for her mother sitting prolonged periods.  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  She has not remember any specific injury that may have caused her symptoms.  Is had no incontinence.  She usually uses Robaxin for the spasms which help.  She had gastric bypass in the past but has used nonsteroidals on occasion.        Past Medical History:  Diagnosis Date  . Asthma   . Depression   . GERD (gastroesophageal reflux disease)   . Hypertension     Patient Active Problem List   Diagnosis Date Noted  . Acute pancreatitis 10/29/2017    Past Surgical History:  Procedure Laterality Date  . GASTRIC BYPASS    . ROTATOR CUFF REPAIR Right     OB History   No obstetric history on file.      Home Medications    Prior to Admission medications   Medication Sig Start Date End Date Taking? Authorizing Provider  albuterol (PROVENTIL HFA;VENTOLIN HFA) 108 (90 BASE) MCG/ACT inhaler Inhale 2 puffs into the lungs every 6 (six) hours as needed for wheezing or shortness of breath.   Yes [provider]  fluticasone (FLONASE) 50 MCG/ACT nasal spray Place 1 spray into both nostrils daily.   Yes [provider]  losartan-hydrochlorothiazide (HYZAAR) 100-25 MG per tablet Take 1 tablet by mouth daily.   Yes [provider]  Multiple Vitamins-Minerals (MULTIVITAMIN ADULT PO) Take 1 tablet by mouth daily.   Yes [provider]  omeprazole  (PRILOSEC) 20 MG capsule Take 20 mg by mouth 2 (two) times daily before a meal.   Yes [provider]  venlafaxine (EFFEXOR) 75 MG tablet Take 75 mg by mouth 3 (three) times daily with meals.   Yes [provider]  meloxicam (MOBIC) 7.5 MG tablet Take 1 tablet (7.5 mg total) by mouth daily. Take with food 10/19/18   Lutricia Feil, PA-C  methocarbamol (ROBAXIN) 750 MG tablet Take 1 tablet (750 mg total) by mouth 3 (three) times daily. As necessary for muscle spasm 10/19/18   Lutricia Feil, PA-C    Family History Family History  Problem Relation Age of Onset  . Hypertension Mother   . Breast cancer Neg Hx     Social History Social History   Tobacco Use  . Smoking status: Current Every Day Smoker    Packs/day: 0.25    Types: Cigarettes  . Smokeless tobacco: Never Used  Substance Use Topics  . Alcohol use: Yes    Alcohol/week: 14.0 standard drinks    Types: 14 Cans of beer per week  . Drug use: No     Allergies   Percocet [oxycodone-acetaminophen] and Vicodin [hydrocodone-acetaminophen]   Review of Systems Review of Systems  Constitutional: Positive for activity change. Negative for appetite change, chills, fatigue and fever.  Musculoskeletal: Positive for back pain,  gait problem and myalgias.  All other systems reviewed and are negative.    Physical Exam Triage Vital Signs ED Triage Vitals  Enc Vitals Group     BP 10/19/18 0951 111/71     Pulse Rate 10/19/18 0951 72     Resp 10/19/18 0951 16     Temp 10/19/18 0951 98.2 F (36.8 C)     Temp Source 10/19/18 0951 Oral     SpO2 10/19/18 0951 94 %     Weight 10/19/18 0947 188 lb (85.3 kg)     Height 10/19/18 0947  (1.549 m)     Head Circumference --      Peak Flow --      Pain Score 10/19/18 0946 10     Pain Loc --      Pain Edu? --      Excl. in GC? --    No data found.  Updated Vital Signs BP 111/71 (BP Location: Left Arm)   Pulse 72   Temp 98.2 F (36.8 C) (Oral)   Resp 16    Ht  (1.549 m)   Wt 188 lb (85.3 kg)   SpO2 94%   BMI 35.52 kg/m   Visual Acuity Right Eye Distance:   Left Eye Distance:   Bilateral Distance:    Right Eye Near:   Left Eye Near:    Bilateral Near:     Physical Exam Vitals signs and nursing note reviewed.  Constitutional:      General: She is not in acute distress.    Appearance: Normal appearance. She is obese. She is not ill-appearing, toxic-appearing or diaphoretic.  HENT:     Head: Normocephalic and atraumatic.     Nose: Nose normal.     Mouth/Throat:     Mouth: Mucous membranes are moist.     Pharynx: Oropharynx is clear.  Eyes:     General:        Right eye: No discharge.        Left eye: No discharge.     Conjunctiva/sclera: Conjunctivae normal.  Neck:     Musculoskeletal: Normal range of motion and neck supple.  Musculoskeletal:        General: Tenderness present.     Comments: Examination shows the patient to be in a wheelchair.  Cane for ambulation.  Has great difficulty in getting in and out of the chair.  She tends to want to favor a stooped position and into forward flexion.  Some tenderness is over the superior posterior iliac area and into the sacroiliac joint.  Is not able to allow on adequate examination due to her pain.  Skin:    General: Skin is warm and dry.  Neurological:     General: No focal deficit present.     Mental Status: She is alert and oriented to person, place, and time.  Psychiatric:        Mood and Affect: Mood normal.        Behavior: Behavior normal.        Thought Content: Thought content normal.        Judgment: Judgment normal.      UC Treatments / Results  Labs (all labs ordered are listed, but only abnormal results are displayed) Labs Reviewed - No data to display  EKG None  Radiology No results found.  Procedures Procedures (including critical care time)  Medications Ordered in UC Medications  ketorolac (TORADOL) injection 60 mg (60 mg Intramuscular Given  10/19/18 1034)    Initial Impression / Assessment and Plan / UC Course  I have reviewed the triage vital signs and the nursing notes.  Pertinent labs & imaging results that were available during my care of the patient were reviewed by me and considered in my medical decision making (see chart for details).   Will give the patient an injection of Toradol for her pain today.  Will need to avoid symptoms as much as possible.  I have recommended application of a ice.  Fight her with lidocaine patches for local pain control.  Given her prescription for Robaxin and also Mobic 7/2 mg that she will take daily with food.  Is not improving she should follow-up with her primary care physician.   Final Clinical Impressions(s) / UC Diagnoses   Final diagnoses:  Sciatica of right side     Discharge Instructions     Apply ice 20 minutes out of every 2 hours 4-5 times daily for comfort. Use  Caution while taking muscle relaxers.  Do not perform activities requiring concentration or judgment and do not drive.     ED Prescriptions    Medication Sig Dispense Auth. Provider   methocarbamol (ROBAXIN) 750 MG tablet Take 1 tablet (750 mg total) by mouth 3 (three) times daily. As necessary for muscle spasm 30 tablet Lutricia Feil, PA-C   meloxicam (MOBIC) 7.5 MG tablet Take 1 tablet (7.5 mg total) by mouth daily. Take with food 14 tablet Lutricia Feil, PA-C     Controlled Substance Prescriptions [place] Controlled Substance Registry consulted? Not Applicable   Lutricia Feil, PA-C 10/19/18 1044

## 2018-10-19 NOTE — Discharge Instructions (Addendum)
Apply ice 20 minutes out of every 2 hours 4-5 times daily for comfort. Use  Caution while taking muscle relaxers.  Do not perform activities requiring concentration or judgment and do not drive.  °

## 2019-03-04 IMAGING — CR DG CHEST 1V
1 series · 1 of 1 positions shown · non-contrast
Comparison: 09/23/2016 chest radiograph

CLINICAL DATA: 56 y/o  F; reaction to PPD skin test.

EXAM:
CHEST  1 VIEW

[chest pa]
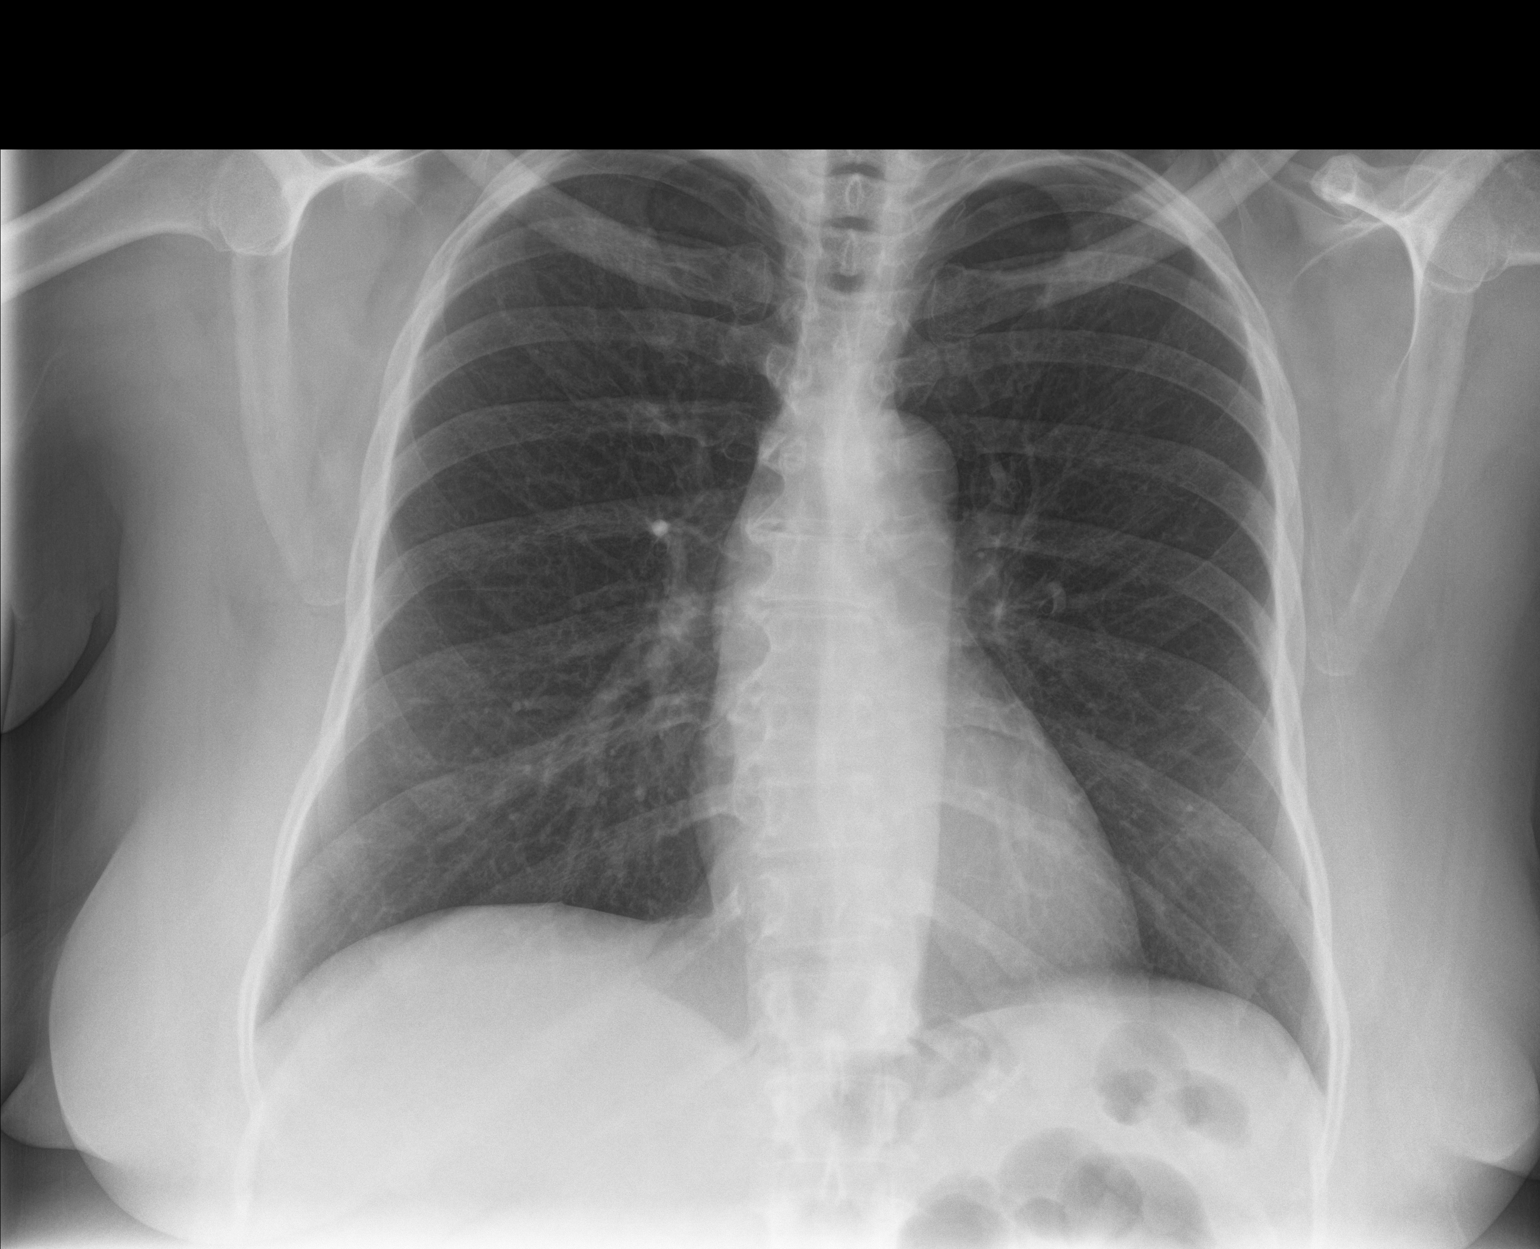

[1 of 1 positions shown; findings below may reference images not displayed]

FINDINGS: Stable heart size and mediastinal contours are within normal limits.
Both lungs are clear. Mild S-shaped curvature of the spine. No acute
osseous abnormality is evident.
IMPRESSION: No active disease.

By: Jaylon Aujla M.D.

## 2019-08-08 ENCOUNTER — Ambulatory Visit
Admission: RE | Admit: 2019-08-08 | Discharge: 2019-08-08 | Disposition: A | Discharge status: Home / Self Care | Payer: BC Managed Care – PPO | Source: Ambulatory Visit | Attending: Family Medicine | Admitting: Family Medicine

## 2019-08-08 ENCOUNTER — Other Ambulatory Visit: Payer: Self-pay

## 2019-08-08 DIAGNOSIS — Z1231 Encounter for screening mammogram for malignant neoplasm of breast: Secondary | ICD-10-CM | POA: Insufficient documentation

## 2019-09-06 DIAGNOSIS — M5030 Other cervical disc degeneration, unspecified cervical region: Secondary | ICD-10-CM | POA: Insufficient documentation

## 2019-09-06 DIAGNOSIS — Z9889 Other specified postprocedural states: Secondary | ICD-10-CM | POA: Insufficient documentation

## 2019-12-11 ENCOUNTER — Other Ambulatory Visit: Payer: Self-pay | Admitting: Orthopedic Surgery

## 2019-12-11 DIAGNOSIS — G8929 Other chronic pain: Secondary | ICD-10-CM

## 2019-12-11 DIAGNOSIS — M5441 Lumbago with sciatica, right side: Secondary | ICD-10-CM

## 2019-12-23 ENCOUNTER — Ambulatory Visit
Admission: RE | Admit: 2019-12-23 | Discharge: 2019-12-23 | Disposition: A | Discharge status: Home / Self Care | Payer: 59 | Source: Ambulatory Visit | Attending: Orthopedic Surgery | Admitting: Orthopedic Surgery

## 2019-12-23 ENCOUNTER — Other Ambulatory Visit: Payer: Self-pay

## 2019-12-23 DIAGNOSIS — M5441 Lumbago with sciatica, right side: Secondary | ICD-10-CM | POA: Diagnosis not present

## 2019-12-23 DIAGNOSIS — G8929 Other chronic pain: Secondary | ICD-10-CM | POA: Diagnosis present

## 2019-12-31 DIAGNOSIS — M48061 Spinal stenosis, lumbar region without neurogenic claudication: Secondary | ICD-10-CM | POA: Insufficient documentation

## 2020-02-28 DIAGNOSIS — M47816 Spondylosis without myelopathy or radiculopathy, lumbar region: Secondary | ICD-10-CM | POA: Insufficient documentation

## 2020-05-22 DIAGNOSIS — Z79899 Other long term (current) drug therapy: Secondary | ICD-10-CM | POA: Insufficient documentation

## 2020-05-22 DIAGNOSIS — I10 Essential (primary) hypertension: Secondary | ICD-10-CM | POA: Diagnosis not present

## 2020-05-22 DIAGNOSIS — K219 Gastro-esophageal reflux disease without esophagitis: Secondary | ICD-10-CM | POA: Diagnosis not present

## 2020-05-22 DIAGNOSIS — J45909 Unspecified asthma, uncomplicated: Secondary | ICD-10-CM | POA: Insufficient documentation

## 2020-05-22 DIAGNOSIS — K852 Alcohol induced acute pancreatitis without necrosis or infection: Secondary | ICD-10-CM | POA: Insufficient documentation

## 2020-05-22 DIAGNOSIS — R1013 Epigastric pain: Secondary | ICD-10-CM | POA: Diagnosis present

## 2020-05-22 DIAGNOSIS — F17210 Nicotine dependence, cigarettes, uncomplicated: Secondary | ICD-10-CM | POA: Insufficient documentation

## 2020-05-23 ENCOUNTER — Emergency Department: Payer: 59

## 2020-05-23 ENCOUNTER — Other Ambulatory Visit: Payer: Self-pay

## 2020-05-23 ENCOUNTER — Encounter: Payer: Self-pay | Admitting: Emergency Medicine

## 2020-05-23 ENCOUNTER — Emergency Department
Admission: EM | Admit: 2020-05-23 | Discharge: 2020-05-24 | Disposition: A | Discharge status: Home / Self Care | Payer: 59 | Attending: Emergency Medicine | Admitting: Emergency Medicine

## 2020-05-23 DIAGNOSIS — K859 Acute pancreatitis without necrosis or infection, unspecified: Secondary | ICD-10-CM

## 2020-05-23 DIAGNOSIS — K852 Alcohol induced acute pancreatitis without necrosis or infection: Secondary | ICD-10-CM

## 2020-05-23 LAB — COMPREHENSIVE METABOLIC PANEL
ALT: 18 U/L (ref 0–44)
AST: 32 U/L (ref 15–41)
Albumin: 3.9 g/dL (ref 3.5–5.0)
Alkaline Phosphatase: 100 U/L (ref 38–126)
Anion gap: 13 (ref 5–15)
BUN: 10 mg/dL (ref 6–20)
CO2: 22 mmol/L (ref 22–32)
Calcium: 8.9 mg/dL (ref 8.9–10.3)
Chloride: 103 mmol/L (ref 98–111)
Creatinine, Ser: 0.64 mg/dL (ref 0.44–1.00)
GFR calc Af Amer: >60 mL/min (ref >60)
GFR calc non Af Amer: >60 mL/min (ref >60)
Glucose, Bld: 106 mg/dL — ABNORMAL HIGH (ref 70–99)
Potassium: 3.8 mmol/L (ref 3.5–5.1)
Sodium: 138 mmol/L (ref 135–145)
Total Bilirubin: 0.7 mg/dL (ref 0.3–1.2)
Total Protein: 7.5 g/dL (ref 6.5–8.1)

## 2020-05-23 LAB — LIPASE, BLOOD: Lipase: 90 U/L — ABNORMAL HIGH (ref 11–51)

## 2020-05-23 LAB — CBC
HCT: 35.6 % — ABNORMAL LOW (ref 36.0–46.0)
Hemoglobin: 12.5 g/dL (ref 12.0–15.0)
MCH: 29.8 pg (ref 26.0–34.0)
MCHC: 35.1 g/dL (ref 30.0–36.0)
MCV: 85 fL (ref 80.0–100.0)
Platelets: 254 10*3/uL (ref 150–400)
RBC: 4.19 MIL/uL (ref 3.87–5.11)
RDW: 15.2 % (ref 11.5–15.5)
WBC: 6.4 10*3/uL (ref 4.0–10.5)
nRBC: 0 % (ref 0.0–0.2)

## 2020-05-23 LAB — TROPONIN I (HIGH SENSITIVITY): Troponin I (High Sensitivity): 8 ng/L (ref <18)

## 2020-05-23 MED ORDER — HYDROMORPHONE HCL 1 MG/ML IJ SOLN
1.0000 mg | Freq: Once | INTRAMUSCULAR | Status: AC
  Administered 2020-05-24: 1 mg via INTRAVENOUS
  Filled 2020-05-23: qty 1

## 2020-05-23 MED ORDER — LACTATED RINGERS IV BOLUS
1000.0000 mL | Freq: Once | INTRAVENOUS | Status: AC
  Administered 2020-05-24: 1000 mL via INTRAVENOUS

## 2020-05-23 MED ORDER — ONDANSETRON HCL 4 MG/2ML IJ SOLN
4.0000 mg | Freq: Once | INTRAMUSCULAR | Status: AC
  Administered 2020-05-24: 4 mg via INTRAVENOUS
  Filled 2020-05-23: qty 2

## 2020-05-23 NOTE — ED Triage Notes (Signed)
Pt presents to ED from home with sudden onset of mid abd pain and vomiting X5 since around 2000. Pt reports having chills as well. Denies fever. States it feels like her pancreatitis.

## 2020-05-23 NOTE — ED Provider Notes (Signed)
[facility] Emergency Department Provider Note  ____________________________________________  Time seen: Approximately 11:46 PM  I have reviewed the triage vital signs and the nursing notes.   HISTORY  Chief Complaint Emesis and Abdominal Pain   HPI Kristen Velasquez is a 59 y.o. female history of asthma, depression, GERD, hypertension, alcohol induced pancreatitis who presents for evaluation of abdominal pain.  Pain started yesterday, sharp, constant, severe, located in the epigastric region radiating to the back.  Associated with nausea and a few episodes of nonbloody nonbilious emesis.  No diarrhea, no constipation, no dysuria, no fever or chills, no chest pain or shortness of breath.  Pain is currently 8/10.  Patient does report drinking 3 x 24oz beers the night before.  Has not had any alcohol since then.  No history of complicated withdrawals.  Past Medical History:  Diagnosis Date  . Asthma   . Depression   . GERD (gastroesophageal reflux disease)   . Hypertension     Patient Active Problem List   Diagnosis Date Noted  . Acute pancreatitis 10/29/2017    Past Surgical History:  Procedure Laterality Date  . GASTRIC BYPASS    . ROTATOR CUFF REPAIR Right     Prior to Admission medications   Medication Sig Start Date End Date Taking? Authorizing Provider  albuterol (PROVENTIL HFA;VENTOLIN HFA) 108 (90 BASE) MCG/ACT inhaler Inhale 2 puffs into the lungs every 6 (six) hours as needed for wheezing or shortness of breath.    [provider]  fluticasone (FLONASE) 50 MCG/ACT nasal spray Place 1 spray into both nostrils daily.    [provider]  HYDROmorphone (DILAUDID) 2 MG tablet Take 0.5 tablets (1 mg total) by mouth every 4 (four) hours as needed for severe pain. 05/24/20   Don Perking, [place], MD  lidocaine (LIDODERM) 5 % Place 1 patch onto the skin daily. Remove & Discard patch within 12 hours or as directed by MD 10/19/18   Lutricia Feil, PA-C  losartan-hydrochlorothiazide (HYZAAR) 100-25 MG per tablet Take 1 tablet by mouth daily.    [provider]  meloxicam (MOBIC) 7.5 MG tablet Take 1 tablet (7.5 mg total) by mouth daily. Take with food 10/19/18   Lutricia Feil, PA-C  methocarbamol (ROBAXIN) 750 MG tablet Take 1 tablet (750 mg total) by mouth 3 (three) times daily. As necessary for muscle spasm 10/19/18   Lutricia Feil, PA-C  Multiple Vitamins-Minerals (MULTIVITAMIN ADULT PO) Take 1 tablet by mouth daily.    [provider]  omeprazole (PRILOSEC) 20 MG capsule Take 20 mg by mouth 2 (two) times daily before a meal.    [provider]  ondansetron (ZOFRAN ODT) 4 MG disintegrating tablet Take 1 tablet (4 mg total) by mouth every 8 (eight) hours as needed. 05/24/20   Nita Sickle, MD  venlafaxine (EFFEXOR) 75 MG tablet Take 75 mg by mouth 3 (three) times daily with meals.    [provider]    Allergies Percocet [oxycodone-acetaminophen] and Vicodin [hydrocodone-acetaminophen]  Family History  Problem Relation Age of Onset  . Hypertension Mother   . Breast cancer Neg Hx     Social History Social History   Tobacco Use  . Smoking status: Current Every Day Smoker    Packs/day: 0.25    Types: Cigarettes  . Smokeless tobacco: Never Used  Vaping Use  . Vaping Use: Never used  Substance Use Topics  . Alcohol use: Yes    Alcohol/week: 14.0 standard drinks  Types: 14 Cans of beer per week  . Drug use: No    Review of Systems  Constitutional: Negative for fever. Eyes: Negative for visual changes. ENT: Negative for sore throat. Neck: No neck pain  Cardiovascular: Negative for chest pain. Respiratory: Negative for shortness of breath. Gastrointestinal: + epigastric abdominal pain, nausea, and vomiting. No diarrhea. Genitourinary: Negative for dysuria. Musculoskeletal: Negative for back pain. Skin: Negative for rash. Neurological: Negative for headaches,  weakness or numbness. Psych: No SI or HI  ____________________________________________   PHYSICAL EXAM:  VITAL SIGNS: ED Triage Vitals  Enc Vitals Group     BP 05/23/20 0022 138/76     Pulse Rate 05/23/20 0022 79     Resp 05/23/20 0022 18     Temp 05/23/20 0022 98 F (36.7 C)     Temp Source 05/23/20 0022 Oral     SpO2 05/23/20 0022 97 %     Weight 05/23/20 0022 187 lb (84.8 kg)     Height --      Head Circumference --      Peak Flow --      Pain Score 05/23/20 0035 8     Pain Loc --      Pain Edu? --      Excl. in GC? --     Constitutional: Alert and oriented. Well appearing and in no apparent distress. HEENT:      Head: Normocephalic and atraumatic.         Eyes: Conjunctivae are normal. Sclera is non-icteric.       Mouth/Throat: Mucous membranes are moist.       Neck: Supple with no signs of meningismus. Cardiovascular: Regular rate and rhythm. No murmurs, gallops, or rubs. Respiratory: Normal respiratory effort. Lungs are clear to auscultation bilaterally.  Gastrointestinal: Soft, tender to palpation epigastric region, and non distended with positive bowel sounds. No rebound or guarding. Musculoskeletal:  No edema, cyanosis, or erythema of extremities. Neurologic: Normal speech and language. Face is symmetric. Moving all extremities. No gross focal neurologic deficits are appreciated. Skin: Skin is warm, dry and intact. No rash noted. Psychiatric: Mood and affect are normal. Speech and behavior are normal.  ____________________________________________   LABS (all labs ordered are listed, but only abnormal results are displayed)  Labs Reviewed  LIPASE, BLOOD - Abnormal; Notable for the following components:      Result Value   Lipase 90 (*)    All other components within normal limits  COMPREHENSIVE METABOLIC PANEL - Abnormal; Notable for the following components:   Glucose, Bld 106 (*)    All other components within normal limits  CBC - Abnormal; Notable for  the following components:   HCT 35.6 (*)    All other components within normal limits  URINALYSIS, COMPLETE (UACMP) WITH MICROSCOPIC - Abnormal; Notable for the following components:   Color, Urine AMBER (*)    APPearance HAZY (*)    Ketones, ur 5 (*)    Leukocytes,Ua TRACE (*)    Bacteria, UA RARE (*)    All other components within normal limits  TROPONIN I (HIGH SENSITIVITY)   ____________________________________________  EKG  ED ECG REPORT I, Nita Sickle, the attending physician, personally viewed and interpreted this ECG.  Normal sinus rhythm, rate of 89, normal intervals, normal axis, T wave inversions inferiorly with no ST elevations. ____________________________________________  RADIOLOGY  I have personally reviewed the images performed during this visit and I agree with the Radiologist's read.   Interpretation by Radiologist:  US  ABDOMEN LIMITED RUQ  Result Date: 05/24/2020 CLINICAL DATA:  Abdominal pain, question of pancreatitis EXAM: ULTRASOUND ABDOMEN LIMITED RIGHT UPPER QUADRANT COMPARISON:  None. FINDINGS: Gallbladder: No gallstones or wall thickening visualized. No sonographic Murphy sign noted by sonographer. Common bile duct: Diameter: 3.3 mm Liver: No focal lesion identified. Within normal limits in parenchymal echogenicity. Portal vein is patent on color Doppler imaging with normal direction of blood flow towards the liver. Other: None. IMPRESSION: Normal examination Electronically Signed   By: Jonna ClarkBindu  Avutu M.D.   On: 05/24/2020 00:18      ____________________________________________   PROCEDURES  Procedure(s) performed:yes .1-3 Lead EKG Interpretation Performed by: Nita SickleVeronese, [place], MD Authorized by: Nita SickleVeronese, [place], MD     Interpretation: non-specific     ECG rate assessment: normal     Rhythm: sinus rhythm     Critical Care performed:  None ____________________________________________   INITIAL IMPRESSION / ASSESSMENT AND PLAN / ED  COURSE  59 y.o. female history of asthma, depression, GERD, hypertension, alcohol induced pancreatitis who presents for evaluation of gastric abdominal pain, nausea and vomiting after drinking 3 x 24 ounce beers 2 days ago.  Patient is well-appearing in no distress with normal vital signs, abdomen is soft and nondistended with epigastric tenderness but no rebound or guarding.  Lipase elevated 90 with normal LFTs.  Patient still has her gallbladder.  Most likely alcohol induced pancreatitis however will get an ultrasound to rule out gallstone pancreatitis.  No leukocytosis, no fever.  Will treat with IV Dilaudid, IV fluids, IV Zofran and reassess for disposition.  Old medical records reviewed.  Patient placed on telemetry for close monitoring  _________________________ 2:19 AM on 05/24/2020 -----------------------------------------  Per Ranson criteria patient is low risk.  Pain well controlled p.o. meds.  Unfortunately patient has anaphylaxis to oxycodone and hydrocodone and usually is discharged home with p.o. Dilaudid.  Will provide with a short course of p.o. Dilaudid, Zofran.  Discussed bland diet, no EtOH, and close follow-up with PCP on Monday.  Discussed return precautions for worsening abdominal pain, fever, or signs of dehydration.    _____________________________________________ Please note:  Patient was evaluated in Emergency Department today for the symptoms described in the history of present illness. Patient was evaluated in the context of the global COVID-19 pandemic, which necessitated consideration that the patient might be at risk for infection with the SARS-CoV-2 virus that causes COVID-19. Institutional protocols and algorithms that pertain to the evaluation of patients at risk for COVID-19 are in a state of rapid change based on information released by regulatory bodies including the CDC and federal and state organizations. These policies and algorithms were followed during the  patient's care in the ED.  Some ED evaluations and interventions may be delayed as a result of limited staffing during the pandemic.   [place] Controlled Substance Database was reviewed by me. ____________________________________________   FINAL CLINICAL IMPRESSION(S) / ED DIAGNOSES   Final diagnoses:  Pancreatitis  Alcohol-induced acute pancreatitis without infection or necrosis      NEW MEDICATIONS STARTED DURING THIS VISIT:  ED Discharge Orders         Ordered    HYDROmorphone (DILAUDID) 2 MG tablet  Every 4 hours PRN        05/24/20 0219    ondansetron (ZOFRAN ODT) 4 MG disintegrating tablet  Every 8 hours PRN        05/24/20 0219           Note:  This document was prepared using Dragon  voice recognition software and may include unintentional dictation errors.    Nita Sickle, MD 05/24/20 [id number]

## 2020-05-24 LAB — URINALYSIS, COMPLETE (UACMP) WITH MICROSCOPIC
Bilirubin Urine: NEGATIVE
Glucose, UA: NEGATIVE mg/dL
Hgb urine dipstick: NEGATIVE
Ketones, ur: 5 mg/dL — AB
Nitrite: NEGATIVE
Protein, ur: NEGATIVE mg/dL
Specific Gravity, Urine: 1.024 (ref 1.005–1.030)
pH: 5 (ref 5.0–8.0)

## 2020-05-24 MED ORDER — HYDROMORPHONE HCL 2 MG PO TABS: 1.0000 mg | ORAL_TABLET | ORAL | 0 refills | Status: DC | PRN

## 2020-05-24 MED ORDER — ONDANSETRON 4 MG PO TBDP: 4.0000 mg | ORAL_TABLET | Freq: Three times a day (TID) | ORAL | 0 refills | Status: AC | PRN

## 2020-05-24 MED ORDER — HYDROMORPHONE HCL 2 MG PO TABS
1.0000 mg | ORAL_TABLET | Freq: Once | ORAL | Status: AC
  Administered 2020-05-24: 1 mg via ORAL
  Filled 2020-05-24: qty 1

## 2020-05-24 MED ORDER — ACETAMINOPHEN 500 MG PO TABS
1000.0000 mg | ORAL_TABLET | Freq: Once | ORAL | Status: AC
  Administered 2020-05-24: 1000 mg via ORAL
  Filled 2020-05-24: qty 2

## 2020-05-24 NOTE — ED Notes (Signed)
Esign not working. Pt verbalized understanding of DC instructions and received copy

## 2020-08-22 ENCOUNTER — Ambulatory Visit
Admission: EM | Admit: 2020-08-22 | Discharge: 2020-08-22 | Disposition: A | Discharge status: Home / Self Care | Payer: 59 | Attending: Family Medicine | Admitting: Family Medicine

## 2020-08-22 ENCOUNTER — Other Ambulatory Visit: Payer: Self-pay

## 2020-08-22 ENCOUNTER — Ambulatory Visit (INDEPENDENT_AMBULATORY_CARE_PROVIDER_SITE_OTHER): Payer: 59

## 2020-08-22 DIAGNOSIS — R059 Cough, unspecified: Secondary | ICD-10-CM

## 2020-08-22 DIAGNOSIS — R062 Wheezing: Secondary | ICD-10-CM | POA: Diagnosis not present

## 2020-08-22 DIAGNOSIS — J45901 Unspecified asthma with (acute) exacerbation: Secondary | ICD-10-CM

## 2020-08-22 MED ORDER — BENZONATATE 200 MG PO CAPS: 200.0000 mg | ORAL_CAPSULE | Freq: Three times a day (TID) | ORAL | 0 refills | Status: DC | PRN

## 2020-08-22 MED ORDER — METHYLPREDNISOLONE SODIUM SUCC 125 MG IJ SOLR
125.0000 mg | Freq: Once | INTRAMUSCULAR | Status: AC
  Administered 2020-08-22: 125 mg via INTRAMUSCULAR

## 2020-08-22 NOTE — ED Provider Notes (Signed)
[facility]    CSN: [id number] Arrival date & time: 08/22/20  1239      History   Chief Complaint Chief Complaint  Patient presents with  . Flank Pain  . Wheezing   HPI   59 year old female presents with cough, wheezing, and left-sided rib pain.  Started 3 days ago.  Patient has underlying asthma and smokes.  Patient reports cough, wheezing.  She reports pain of the left flank/left ribs which she attributes to the coughing.  No fever.  Denies shortness of breath.  Patient has had Covid testing at her work.  She declines this today.  She has been taking over-the-counter medications and her inhaler without relief.  No other complaints at this time.  Past Medical History:  Diagnosis Date  . Asthma   . Depression   . GERD (gastroesophageal reflux disease)   . Hypertension     Patient Active Problem List   Diagnosis Date Noted  . Acute pancreatitis 10/29/2017    Past Surgical History:  Procedure Laterality Date  . GASTRIC BYPASS    . ROTATOR CUFF REPAIR Right     OB History   No obstetric history on file.      Home Medications    Prior to Admission medications   Medication Sig Start Date End Date Taking? Authorizing Provider  benzonatate (TESSALON) 200 MG capsule Take 1 capsule (200 mg total) by mouth 3 (three) times daily as needed for cough. 08/22/20  Yes Mekaila Tarnow G, DO  albuterol (PROVENTIL HFA;VENTOLIN HFA) 108 (90 BASE) MCG/ACT inhaler Inhale 2 puffs into the lungs every 6 (six) hours as needed for wheezing or shortness of breath.    [provider]  fluticasone (FLONASE) 50 MCG/ACT nasal spray Place 1 spray into both nostrils daily.    [provider]  HYDROmorphone (DILAUDID) 2 MG tablet Take 0.5 tablets (1 mg total) by mouth every 4 (four) hours as needed for severe pain. 05/24/20   Don Perking, [place], MD  lidocaine (LIDODERM) 5 % Place 1 patch onto the skin daily. Remove & Discard patch within 12 hours or as directed by MD  10/19/18   Lutricia Feil, PA-C  losartan-hydrochlorothiazide (HYZAAR) 100-25 MG per tablet Take 1 tablet by mouth daily.    [provider]  meloxicam (MOBIC) 7.5 MG tablet Take 1 tablet (7.5 mg total) by mouth daily. Take with food 10/19/18   Lutricia Feil, PA-C  methocarbamol (ROBAXIN) 750 MG tablet Take 1 tablet (750 mg total) by mouth 3 (three) times daily. As necessary for muscle spasm 10/19/18   Lutricia Feil, PA-C  Multiple Vitamins-Minerals (MULTIVITAMIN ADULT PO) Take 1 tablet by mouth daily.    [provider]  omeprazole (PRILOSEC) 20 MG capsule Take 20 mg by mouth 2 (two) times daily before a meal.    [provider]  ondansetron (ZOFRAN ODT) 4 MG disintegrating tablet Take 1 tablet (4 mg total) by mouth every 8 (eight) hours as needed. 05/24/20   Nita Sickle, MD  venlafaxine (EFFEXOR) 75 MG tablet Take 75 mg by mouth 3 (three) times daily with meals.    [provider]    Family History Family History  Problem Relation Age of Onset  . Hypertension Mother   . Breast cancer Neg Hx     Social History Social History   Tobacco Use  . Smoking status: Current Every Day Smoker    Packs/day: 0.25    Types: Cigarettes  . Smokeless tobacco: Never Used  Vaping Use  . Vaping Use: Never used  Substance Use Topics  . Alcohol use: Yes    Alcohol/week: 14.0 standard drinks    Types: 14 Cans of beer per week  . Drug use: No     Allergies   Oxycodone-acetaminophen, Percocet [oxycodone-acetaminophen], Ace inhibitors, Hydrocodone-acetaminophen, Ibuprofen, Morphine, Vicodin [hydrocodone-acetaminophen], and Codeine   Review of Systems Review of Systems  Constitutional: Negative for fever.  Respiratory: Positive for cough and wheezing.    Physical Exam Triage Vital Signs ED Triage Vitals  Enc Vitals Group     BP 08/22/20 1550 (!) 187/83     Pulse Rate 08/22/20 1550 80     Resp 08/22/20 1550 (!) 22     Temp 08/22/20 1550 98.3  F (36.8 C)     Temp Source 08/22/20 1550 Oral     SpO2 08/22/20 1550 99 %     Weight --      Height --      Head Circumference --      Peak Flow --      Pain Score 08/22/20 1549 0     Pain Loc --      Pain Edu? --      Excl. in GC? --    Updated Vital Signs BP (!) 187/83 (BP Location: Left Arm)   Pulse 80   Temp 98.3 F (36.8 C) (Oral)   Resp (!) 22   SpO2 99%   Visual Acuity Right Eye Distance:   Left Eye Distance:   Bilateral Distance:    Right Eye Near:   Left Eye Near:    Bilateral Near:     Physical Exam Vitals and nursing note reviewed.  Constitutional:      General: She is not in acute distress.    Appearance: Normal appearance. She is not ill-appearing.  HENT:     Head: Normocephalic and atraumatic.  Eyes:     General:        Right eye: No discharge.        Left eye: No discharge.     Conjunctiva/sclera: Conjunctivae normal.  Cardiovascular:     Rate and Rhythm: Normal rate and regular rhythm.     Heart sounds: No murmur heard.   Pulmonary:     Effort: Pulmonary effort is normal.     Breath sounds: Wheezing present.  Neurological:     Mental Status: She is alert.  Psychiatric:        Mood and Affect: Mood normal.        Behavior: Behavior normal.    UC Treatments / Results  Labs (all labs ordered are listed, but only abnormal results are displayed) Labs Reviewed - No data to display  EKG   Radiology DG Chest 2 View  Result Date: 08/22/2020 CLINICAL DATA:  Cough and wheezing for 3 days EXAM: CHEST - 2 VIEW COMPARISON:  11/16/2017 FINDINGS: The heart size and mediastinal contours are within normal limits. Both lungs are clear. The visualized skeletal structures are unremarkable. IMPRESSION: No active cardiopulmonary disease. Electronically Signed   By: Sharlet Salina M.D.   On: 08/22/2020 16:12    Procedures Procedures (including critical care time)  Medications Ordered in UC Medications  methylPREDNISolone sodium succinate  (SOLU-MEDROL) 125 mg/2 mL injection 125 mg (125 mg Intramuscular Given 08/22/20 1622)    Initial Impression / Assessment and Plan / UC Course  I have reviewed the triage vital signs and the nursing notes.  Pertinent labs & imaging results that were  available during my care of the patient were reviewed by me and considered in my medical decision making (see chart for details).    59 year old female presents with asthma exacerbation.  Chest x-ray was obtained today was independently reviewed by me.  Interpretation: Negative.  No evidence of pneumonia.  Albuterol every 6 hours as needed.  Tessalon Perles as prescribed.  Patient was given IM Solu-Medrol today.  She elected to do this instead of taking a prednisone taper.  Final Clinical Impressions(s) / UC Diagnoses   Final diagnoses:  Exacerbation of asthma, unspecified asthma severity, unspecified whether persistent     Discharge Instructions     Albuterol every 6 hours as needed.  Cough medication as directed.  Take care  Dr. Adriana Simas   ED Prescriptions    Medication Sig Dispense Auth. Provider   benzonatate (TESSALON) 200 MG capsule Take 1 capsule (200 mg total) by mouth 3 (three) times daily as needed for cough. 30 capsule Tommie Sams, DO     PDMP not reviewed this encounter.   Tommie Sams, [place] 08/22/20 1740

## 2020-08-22 NOTE — Discharge Instructions (Addendum)
Albuterol every 6 hours as needed.  Cough medication as directed.  Take care  Dr. Adriana Simas

## 2020-08-22 NOTE — ED Triage Notes (Signed)
Pt is here with wheezing and cough that started 3 days ago, pt has taken OTC meds and inhaler to relieve discomfort. Pt also has left flank pain that started 3 days ago, pt has not taken any meds to relieve discomfort. Pt states she was already COVID tested yesterday.

## 2020-09-29 ENCOUNTER — Other Ambulatory Visit: Payer: Self-pay | Admitting: Family Medicine

## 2020-09-29 DIAGNOSIS — Z1231 Encounter for screening mammogram for malignant neoplasm of breast: Secondary | ICD-10-CM

## 2020-10-15 ENCOUNTER — Ambulatory Visit
Admission: RE | Admit: 2020-10-15 | Discharge: 2020-10-15 | Disposition: A | Discharge status: Home / Self Care | Payer: 59 | Source: Ambulatory Visit | Attending: Family Medicine | Admitting: Family Medicine

## 2020-10-15 ENCOUNTER — Other Ambulatory Visit: Payer: Self-pay

## 2020-10-15 DIAGNOSIS — Z1231 Encounter for screening mammogram for malignant neoplasm of breast: Secondary | ICD-10-CM | POA: Diagnosis not present

## 2020-12-31 ENCOUNTER — Other Ambulatory Visit: Payer: Self-pay | Admitting: Family Medicine

## 2021-05-04 ENCOUNTER — Other Ambulatory Visit: Payer: Self-pay | Admitting: Family Medicine

## 2021-05-04 DIAGNOSIS — M48062 Spinal stenosis, lumbar region with neurogenic claudication: Secondary | ICD-10-CM

## 2021-05-04 DIAGNOSIS — G8929 Other chronic pain: Secondary | ICD-10-CM

## 2021-05-14 ENCOUNTER — Other Ambulatory Visit: Payer: Self-pay

## 2021-05-14 ENCOUNTER — Ambulatory Visit
Admission: RE | Admit: 2021-05-14 | Discharge: 2021-05-14 | Disposition: A | Discharge status: Home / Self Care | Payer: 59 | Source: Ambulatory Visit | Attending: Family Medicine | Admitting: Family Medicine

## 2021-05-14 DIAGNOSIS — G8929 Other chronic pain: Secondary | ICD-10-CM | POA: Diagnosis present

## 2021-05-14 DIAGNOSIS — M5441 Lumbago with sciatica, right side: Secondary | ICD-10-CM | POA: Diagnosis present

## 2021-05-14 DIAGNOSIS — M48062 Spinal stenosis, lumbar region with neurogenic claudication: Secondary | ICD-10-CM | POA: Diagnosis not present

## 2021-10-05 ENCOUNTER — Other Ambulatory Visit: Payer: Self-pay | Admitting: Family Medicine

## 2021-10-05 DIAGNOSIS — Z1231 Encounter for screening mammogram for malignant neoplasm of breast: Secondary | ICD-10-CM

## 2021-12-01 ENCOUNTER — Encounter (INDEPENDENT_AMBULATORY_CARE_PROVIDER_SITE_OTHER): Payer: Self-pay

## 2021-12-01 ENCOUNTER — Ambulatory Visit
Admission: RE | Admit: 2021-12-01 | Discharge: 2021-12-01 | Disposition: A | Discharge status: Home / Self Care | Payer: 59 | Source: Ambulatory Visit | Attending: Family Medicine | Admitting: Family Medicine

## 2021-12-01 DIAGNOSIS — Z1231 Encounter for screening mammogram for malignant neoplasm of breast: Secondary | ICD-10-CM | POA: Insufficient documentation

## 2022-06-22 ENCOUNTER — Other Ambulatory Visit: Payer: Self-pay | Admitting: Family Medicine

## 2022-06-22 ENCOUNTER — Ambulatory Visit
Admission: RE | Admit: 2022-06-22 | Discharge: 2022-06-22 | Disposition: A | Discharge status: Home / Self Care | Payer: 59 | Attending: Family Medicine | Admitting: Family Medicine

## 2022-06-22 ENCOUNTER — Ambulatory Visit
Admission: RE | Admit: 2022-06-22 | Discharge: 2022-06-22 | Disposition: A | Discharge status: Home / Self Care | Payer: 59 | Source: Ambulatory Visit | Attending: Family Medicine | Admitting: Family Medicine

## 2022-06-22 DIAGNOSIS — R52 Pain, unspecified: Secondary | ICD-10-CM

## 2022-10-27 ENCOUNTER — Other Ambulatory Visit: Payer: Self-pay | Admitting: Family Medicine

## 2022-10-27 DIAGNOSIS — Z1231 Encounter for screening mammogram for malignant neoplasm of breast: Secondary | ICD-10-CM

## 2022-12-07 ENCOUNTER — Ambulatory Visit
Admission: RE | Admit: 2022-12-07 | Discharge: 2022-12-07 | Disposition: A | Discharge status: Home / Self Care | Payer: 59 | Source: Ambulatory Visit | Attending: Family Medicine | Admitting: Family Medicine

## 2022-12-07 DIAGNOSIS — Z1231 Encounter for screening mammogram for malignant neoplasm of breast: Secondary | ICD-10-CM | POA: Diagnosis present

## 2023-09-06 ENCOUNTER — Other Ambulatory Visit: Payer: Self-pay | Admitting: Physical Medicine & Rehabilitation

## 2023-09-06 DIAGNOSIS — M5416 Radiculopathy, lumbar region: Secondary | ICD-10-CM

## 2023-09-19 ENCOUNTER — Ambulatory Visit
Admission: RE | Admit: 2023-09-19 | Discharge: 2023-09-19 | Disposition: A | Discharge status: Home / Self Care | Payer: 59 | Source: Ambulatory Visit | Attending: Physical Medicine & Rehabilitation | Admitting: Physical Medicine & Rehabilitation

## 2023-09-19 DIAGNOSIS — M5416 Radiculopathy, lumbar region: Secondary | ICD-10-CM

## 2024-04-05 ENCOUNTER — Encounter: Payer: Self-pay | Admitting: Student in an Organized Health Care Education/Training Program

## 2024-04-05 ENCOUNTER — Ambulatory Visit
Attending: Student in an Organized Health Care Education/Training Program | Admitting: Student in an Organized Health Care Education/Training Program

## 2024-04-05 VITALS — BP 153/95 | HR 81 | Temp 97.2°F | Resp 20 | Ht 61.0 in | Wt 218.0 lb

## 2024-04-05 DIAGNOSIS — G588 Other specified mononeuropathies: Secondary | ICD-10-CM | POA: Insufficient documentation

## 2024-04-05 DIAGNOSIS — G894 Chronic pain syndrome: Secondary | ICD-10-CM | POA: Diagnosis present

## 2024-04-05 DIAGNOSIS — M5416 Radiculopathy, lumbar region: Secondary | ICD-10-CM | POA: Diagnosis present

## 2024-04-05 DIAGNOSIS — M4726 Other spondylosis with radiculopathy, lumbar region: Secondary | ICD-10-CM

## 2024-04-05 DIAGNOSIS — F1011 Alcohol abuse, in remission: Secondary | ICD-10-CM | POA: Insufficient documentation

## 2024-04-05 DIAGNOSIS — M47816 Spondylosis without myelopathy or radiculopathy, lumbar region: Secondary | ICD-10-CM | POA: Diagnosis present

## 2024-04-05 DIAGNOSIS — G8929 Other chronic pain: Secondary | ICD-10-CM | POA: Diagnosis present

## 2024-04-05 NOTE — Progress Notes (Signed)
 PROVIDER NOTE: Interpretation of information contained herein should be left to medically-trained personnel. Specific patient instructions are provided elsewhere under Patient Instructions section of medical record. This document was created in part using AI and STT-dictation technology, any transcriptional errors that may result from this process are unintentional.  Patient: Kristen Velasquez  Service: E/M Encounter  Provider: Wallie Sherry, MD  DOB: 1960-12-15  Delivery: Face-to-face  Specialty: Interventional Pain Management  MRN: [id number]  Setting: Ambulatory outpatient facility  Specialty designation: 09  Type: New Patient  Location: Outpatient office facility  PCP: Derick Leita POUR, MD  DOS: 04/05/2024    Referring Prov.: Dodson Delon FERNS, MD   Primary Reason(s) for Visit: Encounter for initial evaluation of one or more chronic problems (new to examiner) potentially causing chronic pain, and posing a threat to normal musculoskeletal function. (Level of risk: High) CC: Back Pain (lower)  HPI  Kristen Velasquez is a 64 y.o. year old, female patient, who comes for the first time to our practice referred by Dodson Delon I, MD for our initial evaluation of her chronic pain. She has Acute pancreatitis; Cluneal neuropathy; Lumbar spondylosis; Chronic radicular lumbar pain; Chronic pain syndrome; and History of alcohol abuse on their problem list. Today she comes in for evaluation of her Back Pain (lower)  Pain Assessment: Location: Right, Left, Lower Back Radiating: will radiate to hips bilateral and down side of legs to ankles, not all the time Onset: More than a month ago Duration: Chronic pain Quality: Aching, Constant, Sharp, Nagging, Numbness, Stabbing, Tender Severity: 5 /10 (subjective, self-reported pain score)  Effect on ADL: everything, ADl's, prolonged walking and standing Timing:   Modifying factors: cold, injections helped  for about 3 month BP: (!) 153/95  HR: 81  Onset and  Duration: Gradual Cause of pain: Work related accident or event Severity: NAS-11 at its worse: 5/10, NAS-11 at its best: 5/10, NAS-11 now: 5/10, and NAS-11 on the average: 5/10 Timing: Night and Not influenced by the time of the day Aggravating Factors: Bending, Climbing, Kneeling, Lifiting, Motion, Prolonged sitting, Prolonged standing, Squatting, Walking, and Working Alleviating Factors: Cold packs, Lying down, Resting, Sleeping, Relaxation therapy, and Warm showers or baths Associated Problems: Numbness, Temperature changes, Tingling, Pain that wakes patient up, and Pain that does not allow patient to sleep Quality of Pain: Aching, Sharp, and Tingling Previous Examinations or Tests: MRI scan and Nerve block Previous Treatments: Epidural steroid injections  Ms. Cumpian is being evaluated for possible interventional pain management therapies for the treatment of her chronic pain.   Discussed the use of AI scribe software for clinical note transcription with the patient, who gave verbal consent to proceed.  History of Present Illness   Kristen Velasquez is a 63 year old female who presents with low back pain radiating to the right leg. She was referred by Dr. Dodson for evaluation of her chronic back and leg pain.  She has been experiencing low back pain that radiates into both legs, with the right leg being more affected. Numbness, tingling, and weakness are present in the right leg, but there is no bowel or bladder dysfunction. The pain worsened after a fall in her bedroom following a bilateral L5-S1 transforaminal epidural steroid injection on August 11, 2023, which provided minimal improvement.  She states that her most severe pain is along her iliac crest which is worse with lumbar extension.  Her most recent lumbar MRI was reviewed, and she recalls being told there were changes at L3-L4, L4-L5,  and L5-S1. She has undergone various treatments, including an epidural injection and bilateral  nerve ablation at L3, L4, and L5 on December 21, 2023, which provided relief for about a month before the pain returned. The pain is primarily in the lower back and occasionally radiates into the buttocks, particularly on the right side.  She has a history of substance abuse and has been sober since completing rehabilitation in [place]. She is currently taking gabapentin, 600 mg in the morning, 300 mg in the afternoon, and 600 mg at night, which was increased from an initial dose of 100 mg. Physical therapy and other treatments have been tried in the past without significant long-term relief.       Historic Controlled Substance Pharmacotherapy Review  Historical Monitoring: The patient  reports no history of drug use. List of prior UDS Testing: No results found for: MDMA, COCAINSCRNUR, PCPSCRNUR, PCPQUANT, CANNABQUANT, THCU, ETH, CBDTHCR, D8THCCBX, D9THCCBX Historical Background Evaluation: [place] PMP: PDMP not reviewed this encounter. Review of the past 56-months conducted.              [place] Department of public safety, offender search: Engineer, mining Information) Non-contributory Risk Assessment Profile: Aberrant behavior: None observed or detected today Risk factors for fatal opioid overdose: history of alcoholism, history of substance abuse, history of substance use disorder, and liver disease Fatal overdose hazard ratio (HR): Calculation deferred Non-fatal overdose hazard ratio (HR): Calculation deferred Risk of opioid abuse or dependence: 0.7-3.0% with doses <= 36 MME/day and 6.1-26% with doses >= 120 MME/day. Substance use disorder (SUD) risk level: See below Personal History of Substance Abuse (SUD-Substance use disorder):  Alcohol: Positive Female or Female (alcoholic, cleaned for 2 years)  Illegal Drugs: Positive Female or Female (when younger- went to [facility])  Rx Drugs:    ORT Risk Level calculation: High Risk  Opioid Risk Tool - 04/05/24 0916       Family History of  Substance Abuse   Alcohol Negative    Illegal Drugs Negative    Rx Drugs Negative      Personal History of Substance Abuse   Alcohol Positive Female or Female   alcoholic, cleaned for 2 years   Illegal Drugs Positive Female or Female   when younger- went to [facility]     Age   Age between 76-45 years  No      History of Preadolescent Sexual Abuse   History of Preadolescent Sexual Abuse Negative or Female      Psychological Disease   Psychological Disease Positive    ADD Negative    OCD Negative    Bipolar Negative    Schizophrenia Negative    Depression Positive      Total Score   Opioid Risk Tool Scoring 10    Opioid Risk Interpretation High Risk         ORT Scoring interpretation table:  Score <3 = Low Risk for SUD  Score between 4-7 = Moderate Risk for SUD  Score >8 = High Risk for Opioid Abuse   PHQ-2 Depression Scale:  Total score: 0  PHQ-2 Scoring interpretation table: (Score and probability of major depressive disorder)  Score 0 = No depression  Score 1 = 15.4% Probability  Score 2 = 21.1% Probability  Score 3 = 38.4% Probability  Score 4 = 45.5% Probability  Score 5 = 56.4% Probability  Score 6 = 78.6% Probability   PHQ-9 Depression Scale:  Total score: 0  PHQ-9 Scoring interpretation table:  Score 0-4 = No depression  Score 5-9 = Mild depression  Score 10-14 = Moderate depression  Score 15-19 = Moderately severe depression  Score 20-27 = Severe depression (2.4 times higher risk of SUD and 2.89 times higher risk of overuse)   Pharmacologic Plan: No opioid analgesics.            Initial impression: Poor candidate for opioid analgesics.  Meds   Current Outpatient Medications:    albuterol  (PROVENTIL  HFA;VENTOLIN  HFA) 108 (90 BASE) MCG/ACT inhaler, Inhale 2 puffs into the lungs every 6 (six) hours as needed for wheezing or shortness of breath., Disp: , Rfl:    atorvastatin (LIPITOR) 20 MG tablet, Take 20 mg by mouth daily., Disp: , Rfl:    benzonatate   (TESSALON ) 200 MG capsule, Take 1 capsule (200 mg total) by mouth 3 (three) times daily as needed for cough., Disp: 30 capsule, Rfl: 0   buPROPion (WELLBUTRIN XL) 150 MG 24 hr tablet, Take 150 mg by mouth., Disp: , Rfl:    fluticasone  (FLONASE ) 50 MCG/ACT nasal spray, Place 1 spray into both nostrils daily., Disp: , Rfl:    gabapentin (NEURONTIN) 300 MG capsule, Take 2 capsules p.o. in the morning, 1 capsule p.o. at lunchtime and 2 capsules p.o. in the evening, Disp: , Rfl:    lidocaine  (LIDODERM ) 5 %, Place 1 patch onto the skin daily. Remove & Discard patch within 12 hours or as directed by MD, Disp: 6 patch, Rfl: 0   losartan-hydrochlorothiazide (HYZAAR) 100-25 MG per tablet, Take 1 tablet by mouth daily., Disp: , Rfl:    meloxicam  (MOBIC ) 7.5 MG tablet, Take 1 tablet (7.5 mg total) by mouth daily. Take with food, Disp: 14 tablet, Rfl: 0   methocarbamol  (ROBAXIN ) 750 MG tablet, Take 1 tablet (750 mg total) by mouth 3 (three) times daily. As necessary for muscle spasm, Disp: 30 tablet, Rfl: 0   Multiple Vitamins-Minerals (MULTIVITAMIN ADULT PO), Take 1 tablet by mouth daily., Disp: , Rfl:    omeprazole (PRILOSEC) 20 MG capsule, Take 20 mg by mouth 2 (two) times daily before a meal., Disp: , Rfl:    ondansetron  (ZOFRAN  ODT) 4 MG disintegrating tablet, Take 1 tablet (4 mg total) by mouth every 8 (eight) hours as needed., Disp: 20 tablet, Rfl: 0   venlafaxine  (EFFEXOR ) 75 MG tablet, Take 75 mg by mouth 3 (three) times daily with meals., Disp: , Rfl:   Imaging Review  MR LUMBAR SPINE WO CONTRAST  Narrative CLINICAL DATA:  Chronic right side low back pain and right leg numbness.  EXAM: MRI LUMBAR SPINE WITHOUT CONTRAST  TECHNIQUE: Multiplanar, multisequence MR imaging of the lumbar spine was performed. No intravenous contrast was administered.  COMPARISON:  MRI lumbar spine 05/14/2021.  FINDINGS: Segmentation:  Standard.  Alignment:  Normal.  Vertebrae:  No fracture, evidence of  discitis, or bone lesion.  Conus medullaris and cauda equina: Conus extends to the T12-L1 level. Conus and cauda equina appear normal.  Paraspinal and other soft tissues: Negative.  Disc levels:  The central spinal canal and neural foramina are widely patent at all levels. Disc height and hydration are maintained throughout. Again seen is facet arthropathy in the lower lumbar spine, worst at L4-5. The appearance is unchanged.  IMPRESSION: No change in the appearance of the lumbar spine since the prior exam. Facet arthropathy in the lower lumbar spine is worst at L4-5. The central spinal canal and neural foramina are widely patent at all levels.   Electronically Signed By: Debby Prader M.D. On: 09/29/2023  08:56   DG Hand Complete Left  Narrative CLINICAL DATA:  Pain  EXAM: LEFT HAND - COMPLETE 3+ VIEW  COMPARISON:  None Available.  FINDINGS: No fracture or dislocation is seen. Minimal bony spurs seen in the distal interphalangeal joints. There are no focal lytic or sclerotic lesions.  IMPRESSION: No fracture or dislocation is seen in left hand. Minimal bony spurs seen in the distal interphalangeal joints.   Electronically Signed By: Gearldine Mary M.D. On: 06/24/2022 20:43   Complexity Note: Imaging results reviewed.                         ROS  Cardiovascular: High blood pressure Pulmonary or Respiratory: Wheezing and difficulty taking a deep full breath (Asthma), Smoking, and Temporary stoppage of breathing during sleep Neurological: No reported neurological signs or symptoms such as seizures, abnormal skin sensations, urinary and/or fecal incontinence, being born with an abnormal open spine and/or a tethered spinal cord Psychological-Psychiatric: Anxiousness and Prone to panicking Gastrointestinal: Reflux or heatburn Genitourinary: No reported renal or genitourinary signs or symptoms such as difficulty voiding or producing urine, peeing blood,  non-functioning kidney, kidney stones, difficulty emptying the bladder, difficulty controlling the flow of urine, or chronic kidney disease Hematological: No reported hematological signs or symptoms such as prolonged bleeding, low or poor functioning platelets, bruising or bleeding easily, hereditary bleeding problems, low energy levels due to low hemoglobin or being anemic Endocrine: No reported endocrine signs or symptoms such as high or low blood sugar, rapid heart rate due to high thyroid levels, obesity or weight gain due to slow thyroid or thyroid disease Rheumatologic: No reported rheumatological signs and symptoms such as fatigue, joint pain, tenderness, swelling, redness, heat, stiffness, decreased range of motion, with or without associated rash Musculoskeletal: Negative for myasthenia gravis, muscular dystrophy, multiple sclerosis or malignant hyperthermia Work History: Working part time  Allergies  Ms. Aguiniga is allergic to oxycodone-acetaminophen, percocet [oxycodone-acetaminophen], ace inhibitors, hydrocodone-acetaminophen, ibuprofen, morphine, vicodin [hydrocodone-acetaminophen], and codeine.  Laboratory Chemistry Profile   Renal Lab Results  Component Value Date   BUN 10 05/23/2020   CREATININE 0.64 05/23/2020   GFRAA >60 05/23/2020   GFRNONAA >60 05/23/2020   PROTEINUR NEGATIVE 05/23/2020     Electrolytes Lab Results  Component Value Date   NA 138 05/23/2020   K 3.8 05/23/2020   CL 103 05/23/2020   CALCIUM 8.9 05/23/2020     Hepatic Lab Results  Component Value Date   AST 32 05/23/2020   ALT 18 05/23/2020   ALBUMIN 3.9 05/23/2020   ALKPHOS 100 05/23/2020   LIPASE 90 (H) 05/23/2020     ID No results found for: LYMEIGGIGMAB, HIV, SARSCOV2NAA, STAPHAUREUS, MRSAPCR, HCVAB, PREGTESTUR, RMSFIGG, QFVRPH1IGG, QFVRPH2IGG   Bone No results found for: VD25OH, CI874NY7UNU, CI6874NY7, CI7874NY7, 25OHVITD1, 25OHVITD2, 25OHVITD3,  TESTOFREE, TESTOSTERONE   Endocrine Lab Results  Component Value Date   GLUCOSE 106 (H) 05/23/2020   GLUCOSEU NEGATIVE 05/23/2020     Neuropathy No results found for: VITAMINB12, FOLATE, HGBA1C, HIV   CNS No results found for: COLORCSF, APPEARCSF, RBCCOUNTCSF, WBCCSF, POLYSCSF, LYMPHSCSF, EOSCSF, PROTEINCSF, GLUCCSF, JCVIRUS, CSFOLI, IGGCSF, LABACHR, ACETBL   Inflammation (CRP: Acute  ESR: Chronic) No results found for: CRP, ESRSEDRATE, LATICACIDVEN   Rheumatology No results found for: RF, ANA, LABURIC, URICUR, LYMEIGGIGMAB, LYMEABIGMQN, HLAB27   Coagulation Lab Results  Component Value Date   PLT 254 05/23/2020     Cardiovascular Lab Results  Component Value Date   TROPONINI <0.03 09/23/2016  HGB 12.5 05/23/2020   HCT 35.6 (L) 05/23/2020     Screening No results found for: SARSCOV2NAA, COVIDSOURCE, STAPHAUREUS, MRSAPCR, HCVAB, HIV, PREGTESTUR   Cancer No results found for: CEA, CA125, LABCA2   Allergens No results found for: ALMOND, APPLE, ASPARAGUS, AVOCADO, BANANA, BARLEY, BASIL, BAYLEAF, GREENBEAN, LIMABEAN, WHITEBEAN, BEEFIGE, REDBEET, BLUEBERRY, BROCCOLI, CABBAGE, MELON, CARROT, CASEIN, CASHEWNUT, CAULIFLOWER, CELERY     Note: Lab results reviewed.  PFSH  Drug: Ms. Bittman  reports no history of drug use. Alcohol:  reports current alcohol use of about 14.0 standard drinks of alcohol per week. Tobacco:  reports that she has been smoking cigarettes. She has never used smokeless tobacco. Medical:  has a past medical history of Asthma, Depression, GERD (gastroesophageal reflux disease), and Hypertension. Family: family history includes Hypertension in her mother.  Past Surgical History:  Procedure Laterality Date   GASTRIC BYPASS     ROTATOR CUFF REPAIR Right    Active Ambulatory Problems    Diagnosis Date Noted   Acute pancreatitis  10/29/2017   Cluneal neuropathy 04/05/2024   Lumbar spondylosis 04/05/2024   Chronic radicular lumbar pain 04/05/2024   Chronic pain syndrome 04/05/2024   History of alcohol abuse 04/05/2024   Resolved Ambulatory Problems    Diagnosis Date Noted   No Resolved Ambulatory Problems   Past Medical History:  Diagnosis Date   Asthma    Depression    GERD (gastroesophageal reflux disease)    Hypertension    Constitutional Exam  General appearance: Well nourished, well developed, and well hydrated. In no apparent acute distress Vitals:   04/05/24 0908  BP: (!) 153/95  Pulse: 81  Resp: 20  Temp: (!) 97.2 F (36.2 C)  SpO2: 98%  Weight: 218 lb (98.9 kg)  Height: 5' 1 (1.549 m)   BMI Assessment: Estimated body mass index is 41.19 kg/m as calculated from the following:   Height as of this encounter: 5' 1 (1.549 m).   Weight as of this encounter: 218 lb (98.9 kg).  BMI interpretation table: BMI level Category Range association with higher incidence of chronic pain  <18 kg/m2 Underweight   18.5-24.9 kg/m2 Ideal body weight   25-29.9 kg/m2 Overweight Increased incidence by 20%  30-34.9 kg/m2 Obese (Class I) Increased incidence by 68%  35-39.9 kg/m2 Severe obesity (Class II) Increased incidence by 136%  >40 kg/m2 Extreme obesity (Class III) Increased incidence by 254%   Patient's current BMI Ideal Body weight  Body mass index is 41.19 kg/m. Ideal body weight: 47.8 kg (105 lb 6.1 oz) Adjusted ideal body weight: 68.2 kg (150 lb 6.8 oz)   BMI Readings from Last 4 Encounters:  04/05/24 41.19 kg/m  05/23/20 35.33 kg/m  10/19/18 35.52 kg/m  10/29/17 34.16 kg/m   Wt Readings from Last 4 Encounters:  04/05/24 218 lb (98.9 kg)  05/23/20 187 lb (84.8 kg)  10/19/18 188 lb (85.3 kg)  10/29/17 180 lb 12.8 oz (82 kg)    Psych/Mental status: Alert, oriented x 3 (person, place, & time)       Eyes: PERLA Respiratory: No evidence of acute respiratory distress  Lumbar Spine Area  Exam  Skin & Axial Inspection: No masses, redness, or swelling Alignment: Symmetrical Functional ROM: Pain restricted ROM affecting both sides Stability: No instability detected Muscle Tone/Strength: Functionally intact. No obvious neuro-muscular anomalies detected. Sensory (Neurological): Musculoskeletal pain pattern Palpation: No palpable anomalies       Provocative Tests: Hyperextension/rotation test: (+) due to pain. Lumbar quadrant test (Kemp's test): (+)  bilaterally for facet joint pain.  Gait & Posture Assessment  Ambulation: Unassisted Gait: Relatively normal for age and body habitus Posture: WNL  Lower Extremity Exam    Side: Right lower extremity  Side: Left lower extremity  Stability: No instability observed          Stability: No instability observed          Skin & Extremity Inspection: Skin color, temperature, and hair growth are WNL. No peripheral edema or cyanosis. No masses, redness, swelling, asymmetry, or associated skin lesions. No contractures.  Skin & Extremity Inspection: Skin color, temperature, and hair growth are WNL. No peripheral edema or cyanosis. No masses, redness, swelling, asymmetry, or associated skin lesions. No contractures.  Functional ROM: Unrestricted ROM                  Functional ROM: Unrestricted ROM                  Muscle Tone/Strength: Functionally intact. No obvious neuro-muscular anomalies detected.  Muscle Tone/Strength: Functionally intact. No obvious neuro-muscular anomalies detected.  Sensory (Neurological): Unimpaired        Sensory (Neurological): Unimpaired        DTR: Patellar: deferred today Achilles: deferred today Plantar: deferred today  DTR: Patellar: deferred today Achilles: deferred today Plantar: deferred today  Palpation: No palpable anomalies  Palpation: No palpable anomalies    Assessment  Primary Diagnosis & Pertinent Problem List: The primary encounter diagnosis was Cluneal neuropathy. Diagnoses of Lumbar facet  arthropathy, Lumbar spondylosis, Chronic radicular lumbar pain, Chronic pain syndrome, and History of alcohol abuse were also pertinent to this visit.  Visit Diagnosis (New problems to examiner): 1. Cluneal neuropathy   2. Lumbar facet arthropathy   3. Lumbar spondylosis   4. Chronic radicular lumbar pain   5. Chronic pain syndrome   6. History of alcohol abuse    Plan of Care (Initial workup plan)  Assessment and Plan    Lumbar facet arthropathy with chronic low back pain and right leg radiculopathy   Chronic low back pain with right leg radiculopathy worsened after a fall post-epidural steroid injection. MRI reveals moderate facet arthropathy at L3-L4, L4-L5, and L5-S1. Previous treatments, including bilateral L5-S1 transforaminal ESI and bilateral nerve ablation at L3, L4, and L5, provided temporary relief. Current symptoms are numbness, tingling, and weakness in the right leg, with pain exacerbated by prolonged sitting and relieved by bending forward. No bowel or bladder dysfunction is reported.  Future considerations include Sprint peripheral nerve stimulation at L4 bilaterally versus repeat lumbar RFA   Cluneal neuropathy: Pain overlying iliac crest worse with lumbar extension.  Tenderness to palpation over iliac crest.  Discussed diagnostic cluneal nerve block.  History of substance abuse disorder, history of alcoholism; currently sober after attending rehab.  Continue to monitor.  Avoid opioid analgesics.  Medication trials in the future could include buprenorphine: Butrans patch versus belbuca        Procedure Orders         CLUNEAL NERVE BLOCK       Provider-requested follow-up: Return in about 4 days (around 04/09/2024) for B/L cluneal NB, in clinic NS.  No future appointments.  I discussed the assessment and treatment plan with the patient. The patient was provided an opportunity to ask questions and all were answered. The patient agreed with the plan and demonstrated an  understanding of the instructions.  Patient advised to call back or seek an in-person evaluation if the symptoms or condition  worsens.  Duration of encounter: .  Total time on encounter, as per AMA guidelines included both the face-to-face and non-face-to-face time personally spent by the physician and/or other qualified health care professional(s) on the day of the encounter (includes time in activities that require the physician or other qualified health care professional and does not include time in activities normally performed by clinical staff). Physician's time may include the following activities when performed: Preparing to see the patient (e.g., pre-charting review of records, searching for previously ordered imaging, lab work, and nerve conduction tests) Review of prior analgesic pharmacotherapies. Reviewing PMP Interpreting ordered tests (e.g., lab work, imaging, nerve conduction tests) Performing post-procedure evaluations, including interpretation of diagnostic procedures Obtaining and/or reviewing separately obtained history Performing a medically appropriate examination and/or evaluation Counseling and educating the patient/family/caregiver Ordering medications, tests, or procedures Referring and communicating with other health care professionals (when not separately reported) Documenting clinical information in the electronic or other health record Independently interpreting results (not separately reported) and communicating results to the patient/ family/caregiver Care coordination (not separately reported)  Note by: Wallie Sherry, MD (TTS and AI technology used. I apologize for any typographical errors that were not detected and corrected.) Date: 04/05/2024; Time: 9:44 AM

## 2024-04-05 NOTE — Progress Notes (Signed)
 Safety precautions to be maintained throughout the outpatient stay will include: orient to surroundings, keep bed in low position, maintain call bell within reach at all times, provide assistance with transfer out of bed and ambulation.

## 2024-04-05 NOTE — Patient Instructions (Signed)
 ______________________________________________________________________    Preparing for your procedure  Appointments: If you think you may not be able to keep your appointment, call 24-48 hours in advance to cancel. We need time to make it available to others.  Procedure visits are for procedures only. During your procedure appointment there will be: NO Prescription Refills*. NO medication changes or discussions*. NO discussion of disability issues*. NO unrelated pain problem evaluations*. NO evaluations to order other pain procedures*. *These will be addressed at a separate and distinct evaluation encounter on the provider's evaluation schedule and not during procedure days.  Instructions: Food intake: Avoid eating anything solid for at least 8 hours prior to your procedure. Clear liquid intake: You may take clear liquids such as water up to 2 hours prior to your procedure. (No carbonated drinks. No soda.) Transportation: Unless otherwise stated by your physician, bring a driver. (Driver cannot be a Market researcher, Pharmacist, community, or any other form of public transportation.) Morning Medicines: Except for blood thinners, take all of your other morning medications with a sip of water. Make sure to take your heart and blood pressure medicines. If your blood pressure's lower number is above 100, the case will be rescheduled. Blood thinners: Make sure to stop your blood thinners as instructed.  If you take a blood thinner, but were not instructed to stop it, call our office [phone] and ask to talk to a nurse. Not stopping a blood thinner prior to certain procedures could lead to serious complications. Diabetics on insulin: Notify the staff so that you can be scheduled 1st case in the morning. If your diabetes requires high dose insulin, take only  of your normal insulin dose the morning of the procedure and notify the staff that you have done so. Preventing infections: Shower with an antibacterial soap the  morning of your procedure.  Build-up your immune system: Take 1000 mg of Vitamin C with every meal (3 times a day) the day prior to your procedure. Antibiotics: Inform the nursing staff if you are taking any antibiotics or if you have any conditions that may require antibiotics prior to procedures. (Example: recent joint implants)   Pregnancy: If you are pregnant make sure to notify the nursing staff. Not doing so may result in injury to the fetus, including death.  Sickness: If you have a cold, fever, or any active infections, call and cancel or reschedule your procedure. Receiving steroids while having an infection may result in complications. Arrival: You must be in the facility at least 30 minutes prior to your scheduled procedure. Tardiness: Your scheduled time is also the cutoff time. If you do not arrive at least 15 minutes prior to your procedure, you will be rescheduled.  Children: Do not bring any children with you. Make arrangements to keep them home. Dress appropriately: There is always a possibility that your clothing may get soiled. Avoid long dresses. Valuables: Do not bring any jewelry or valuables.  Reasons to call and reschedule or cancel your procedure: (Following these recommendations will minimize the risk of a serious complication.) Surgeries: Avoid having procedures within 2 weeks of any surgery. (Avoid for 2 weeks before or after any surgery). Flu Shots: Avoid having procedures within 2 weeks of a flu shots or . (Avoid for 2 weeks before or after immunizations). Barium: Avoid having a procedure within 7-10 days after having had a radiological study involving the use of radiological contrast. (Myelograms, Barium swallow or enema study). Heart attacks: Avoid any elective procedures or surgeries for the  initial 6 months after a Myocardial Infarction (Heart Attack). Blood thinners: It is imperative that you stop these medications before procedures. Let us  know if you if you take  any blood thinner.  Infection: Avoid procedures during or within two weeks of an infection (including chest colds or gastrointestinal problems). Symptoms associated with infections include: Localized redness, fever, chills, night sweats or profuse sweating, burning sensation when voiding, cough, congestion, stuffiness, runny nose, sore throat, diarrhea, nausea, vomiting, cold or Flu symptoms, recent or current infections. It is specially important if the infection is over the area that we intend to treat. Heart and lung problems: Symptoms that may suggest an active cardiopulmonary problem include: cough, chest pain, breathing difficulties or shortness of breath, dizziness, ankle swelling, uncontrolled high or unusually low blood pressure, and/or palpitations. If you are experiencing any of these symptoms, cancel your procedure and contact your primary care physician for an evaluation.  Remember:  Regular Business hours are:  Monday to Thursday 8:00 AM to 4:00 PM  Provider's Schedule: Eric Como, MD:  Procedure days: Tuesday and Thursday 7:30 AM to 4:00 PM  Wallie Sherry, MD:  Procedure days: Monday and Wednesday 7:30 AM to 4:00 PM Last  Updated: 08/02/2023 ______________________________________________________________________    Your physician has ordered a Nerve Conduction Study (NCV) and/or EMG testing.  This is a test to assess the status of your nerves and muscles. For the NCV portion of the test , sticky tabs will be placed on either your hands or feet.  Your nerves will be stimulated using small electrical charges and the speed of the impulse will be measured as it travels down the nerve. For the EMG portion of the test, a small pin will be placed below the surface of the skin to measure the electrical activity in certain muscles in your arms or legs. Eating/drinking prior to testing is ok.  Please DO NOT discontinue ANY medications prior to testing  Your appointment will be  scheduled by a member of our team.  You will need to check in with the main reception desk. Your test could take approximately 30 minutes to 1 hour to complete depending on the extent of the test that has been ordered. TESTING ON LEGS/BACK/HIP/FOOT AREA: Bring/wear a pair of shorts.  **ABSOLUTELY NO LOTIONS, MOISTURIZERS, VASELINE, OILS OR CREAMS OF ANY KIND ON ANY BODY PART THE DAY OF THE TEST**  **DEODORANT IS OK TO WEAR**  Please make every effort to keep your scheduled appointment time, as your physician needs the test results prior to your next appointment.  If you have to cancel or reschedule, please do so as soon as possible, so that another patient may use that testing time slot.  If you cancel your appointment, you may also need to reschedule your referring doctor's appointment until the test and results can be completed.  Please call [phone] and ask for Dr. Lyda assistant if you need to do so.  If you have any questions at any time please let us  know.  We look forward to working with you!!

## 2024-05-14 ENCOUNTER — Ambulatory Visit
Admission: RE | Admit: 2024-05-14 | Discharge: 2024-05-14 | Disposition: A | Discharge status: Home / Self Care | Source: Ambulatory Visit | Attending: Student in an Organized Health Care Education/Training Program | Admitting: Student in an Organized Health Care Education/Training Program

## 2024-05-14 ENCOUNTER — Encounter: Payer: Self-pay | Admitting: Student in an Organized Health Care Education/Training Program

## 2024-05-14 ENCOUNTER — Ambulatory Visit (HOSPITAL_BASED_OUTPATIENT_CLINIC_OR_DEPARTMENT_OTHER): Admitting: Student in an Organized Health Care Education/Training Program

## 2024-05-14 DIAGNOSIS — M545 Low back pain, unspecified: Secondary | ICD-10-CM | POA: Diagnosis not present

## 2024-05-14 DIAGNOSIS — M792 Neuralgia and neuritis, unspecified: Secondary | ICD-10-CM | POA: Insufficient documentation

## 2024-05-14 DIAGNOSIS — G894 Chronic pain syndrome: Secondary | ICD-10-CM

## 2024-05-14 DIAGNOSIS — G588 Other specified mononeuropathies: Secondary | ICD-10-CM | POA: Diagnosis not present

## 2024-05-14 MED ORDER — LIDOCAINE HCL 2 % IJ SOLN
20.0000 mL | Freq: Once | INTRAMUSCULAR | Status: AC
  Administered 2024-05-14: 100 mg

## 2024-05-14 MED ORDER — DEXAMETHASONE SODIUM PHOSPHATE 10 MG/ML IJ SOLN
20.0000 mg | Freq: Once | INTRAMUSCULAR | Status: AC
  Administered 2024-05-14: 20 mg
  Filled 2024-05-14: qty 2

## 2024-05-14 MED ORDER — ROPIVACAINE HCL 2 MG/ML IJ SOLN
18.0000 mL | Freq: Once | INTRAMUSCULAR | Status: AC
  Administered 2024-05-14: 18 mL via PERINEURAL
  Filled 2024-05-14: qty 20

## 2024-05-14 MED ORDER — DEXAMETHASONE SODIUM PHOSPHATE 10 MG/ML IJ SOLN
INTRAMUSCULAR | Status: AC
  Filled 2024-05-14: qty 1

## 2024-05-14 NOTE — Progress Notes (Signed)
 PROVIDER NOTE: Interpretation of information contained herein should be left to medically-trained personnel. Specific patient instructions are provided elsewhere under Patient Instructions section of medical record. This document was created in part using STT-dictation technology, any transcriptional errors that may result from this process are unintentional.  Patient: Kristen Velasquez Type: Established DOB: May 03, 1961 MRN: [id number] PCP: Derick Leita POUR, MD  Service: Procedure DOS: 05/14/2024 Setting: Ambulatory Location: Ambulatory outpatient facility Delivery: Face-to-face Provider: Wallie Sherry, MD Specialty: Interventional Pain Management Specialty designation: 09 Location: Outpatient facility Ref. Prov.: Sherry Wallie, MD       Interventional Therapy   Procedure Bilateral cluneal nerve block for cluneal neuralgia  Imaging: Fluoroscopy-guided Non-spinal ([id number]) Anesthesia: Local anesthesia (1-2% Lidocaine ) DOS: 05/14/2024  Performed by: Wallie Sherry, MD  Purpose: Diagnostic/Therapeutic Indications: Cluneal neuralgia Rationale (medical necessity): procedure needed and proper for the diagnosis and/or treatment of [name]'s medical symptoms and needs. 1. Cluneal neuropathy   2. Chronic pain syndrome    NAS-11 Pain score:   Pre-procedure: 7 /10   Post-procedure: 7 /10      Position / Prep / Materials:  Position: Prone  Prep solution: ChloraPrep (2% chlorhexidine gluconate and 70% isopropyl alcohol) Prep Area: Entire posterior lumbosacral area  Materials:  Tray: Block Needle(s):  Type: Spinal  Gauge (G): 22  Length: 3.5-in Qty: One (1) per procedure side.  H&P (Pre-op Assessment):  [name] is a 63 y.o. (year old), female patient, seen today for interventional treatment. She  has a past surgical history that includes Gastric bypass and Rotator cuff repair (Right). [name] has a current medication list which includes the following prescription(s): albuterol ,  atorvastatin, benzonatate , bupropion, fluticasone , gabapentin, lidocaine , losartan-hydrochlorothiazide, meloxicam , methocarbamol , mounjaro, multiple vitamin, omeprazole, ondansetron , and venlafaxine . Her primarily concern today is the Back Pain (Lumbar bilateral )  Initial Vital Signs:  Pulse/HCG Rate: 87ECG Heart Rate: 84 Temp: 97.9 F (36.6 C) Resp: 16 BP: 134/78 SpO2: 94 %  BMI: Estimated body mass index is 42.51 kg/m as calculated from the following:   Height as of this encounter: 5' 1 (1.549 m).   Weight as of this encounter: 225 lb (102.1 kg).  Risk Assessment: Allergies: Reviewed. She is allergic to oxycodone-acetaminophen , percocet [oxycodone-acetaminophen ], ace inhibitors, hydrocodone-acetaminophen , ibuprofen, morphine , vicodin [hydrocodone-acetaminophen ], and codeine.  Allergy Precautions: None required Coagulopathies: Reviewed. None identified.  Blood-thinner therapy: None at this time Active Infection(s): Reviewed. None identified. [name] is afebrile  Site Confirmation: [name] was asked to confirm the procedure and laterality before marking the site Procedure checklist: Completed Consent: Before the procedure and under the influence of no sedative(s), amnesic(s), or anxiolytics, the patient was informed of the treatment options, risks and possible complications. To fulfill our ethical and legal obligations, as recommended by the American Medical Association's Code of Ethics, I have informed the patient of my clinical impression; the nature and purpose of the treatment or procedure; the risks, benefits, and possible complications of the intervention; the alternatives, including doing nothing; the risk(s) and benefit(s) of the alternative treatment(s) or procedure(s); and the risk(s) and benefit(s) of doing nothing. The patient was provided information about the general risks and possible complications associated with the procedure. These may include, but are not limited  to: failure to achieve desired goals, infection, bleeding, organ or nerve damage, allergic reactions, paralysis, and death. In addition, the patient was informed of those risks and complications associated to the procedure, such as failure to decrease pain; infection; bleeding; organ or nerve damage with subsequent damage to sensory,  motor, and/or autonomic systems, resulting in permanent pain, numbness, and/or weakness of one or several areas of the body; allergic reactions; (i.e.: anaphylactic reaction); and/or death. Furthermore, the patient was informed of those risks and complications associated with the medications. These include, but are not limited to: allergic reactions (i.e.: anaphylactic or anaphylactoid reaction(s)); adrenal axis suppression; blood sugar elevation that in diabetics may result in ketoacidosis or comma; water retention that in patients with history of congestive heart failure may result in shortness of breath, pulmonary edema, and decompensation with resultant heart failure; weight gain; swelling or edema; medication-induced neural toxicity; particulate matter embolism and blood vessel occlusion with resultant organ, and/or nervous system infarction; and/or aseptic necrosis of one or more joints. Finally, the patient was informed that Medicine is not an exact science; therefore, there is also the possibility of unforeseen or unpredictable risks and/or possible complications that may result in a catastrophic outcome. The patient indicated having understood very clearly. We have given the patient no guarantees and we have made no promises. Enough time was given to the patient to ask questions, all of which were answered to the patient's satisfaction. [name] has indicated that she wanted to continue with the procedure. Attestation: I, the ordering provider, attest that I have discussed with the patient the benefits, risks, side-effects, alternatives, likelihood of achieving goals, and  potential problems during recovery for the procedure that I have provided informed consent. Date  Time: 05/14/2024  2:06 PM  Pre-Procedure Preparation:  Monitoring: As per clinic protocol. Respiration, ETCO2, SpO2, BP, heart rate and rhythm monitor placed and checked for adequate function Safety Precautions: Patient was assessed for positional comfort and pressure points before starting the procedure. Time-out: I initiated and conducted the Time-out before starting the procedure, as per protocol. The patient was asked to participate by confirming the accuracy of the Time Out information. Verification of the correct person, site, and procedure were performed and confirmed by me, the nursing staff, and the patient. Time-out conducted as per Joint Commission's Universal Protocol (UP.01.01.01). Time: 1441 Start Time: 1441 hrs.  Description/Narrative of Procedure:          Start Time: 1441 hrs.  The skin over the right posterior iliac crest region was prepped with chlorhexidine and draped in a sterile fashion. Using anatomic landmarks and fluoroscopic guidance, a 25-gauge spinal needle was advanced to the region approximately 7-8 cm lateral to midline and 1-2 cm superior to the right posterior superior iliac spine (PSIS), consistent with the expected course of the right superior cluneal nerve. After negative aspiration, 10 mL of injectate composed of: 9mL of 0.2% Ropivacaine , and 1 mL of Decadron  (10 mg/mL) was injected slowly without resistance.  The needle was withdrawn and hemostasis achieved.   This was repeated for the left side as well. 10 mL of injectate composed of: 9mL of 0.2% Ropivacaine , and 1 mL of Decadron  (10 mg/mL) was injected slowly without resistance.   The patient tolerated the procedure well without immediate complications.   Vitals:   05/14/24 1412 05/14/24 1440 05/14/24 1446  BP: 134/78 (!) 165/102 (!) 168/98  Pulse: 87    Resp: 16 15 17   Temp: 97.9 F (36.6 C)     TempSrc: Temporal    SpO2: 94% 95% 95%  Weight: 225 lb (102.1 kg)    Height: 5' 1 (1.549 m)       End Time: 1445 hrs.  Imaging Guidance (Non-Spinal):          Type of Imaging  Technique: Fluoroscopy Guidance (Non-Spinal) Indication(s): Fluoroscopy guidance for needle placement to enhance accuracy in procedures requiring precise needle localization for targeted delivery of medication in or near specific anatomical locations not easily accessible without such real-time imaging assistance. Exposure Time: Please see nurses notes. Contrast: None used. Fluoroscopic Guidance: I was personally present during the use of fluoroscopy. Tunnel Vision Technique used to obtain the best possible view of the target area. Parallax error corrected before commencing the procedure. Direction-depth-direction technique used to introduce the needle under continuous pulsed fluoroscopy. Once target was reached, antero-posterior, oblique, and lateral fluoroscopic projection used confirm needle placement in all planes. Images permanently stored in EMR. Interpretation: No contrast injected. I personally interpreted the imaging intraoperatively. Adequate needle placement confirmed in multiple planes. Permanent images saved into the patient's record.  Post-operative Assessment:  Post-procedure Vital Signs:  Pulse/HCG Rate: 8784 Temp: 97.9 F (36.6 C) Resp: 17 BP: (!) 168/98 SpO2: 95 %  EBL: None  Complications: No immediate post-treatment complications observed by team, or reported by patient.  Note: The patient tolerated the entire procedure well. A repeat set of vitals were taken after the procedure and the patient was kept under observation following institutional policy, for this type of procedure. Post-procedural neurological assessment was performed, showing return to baseline, prior to discharge. The patient was provided with post-procedure discharge instructions, including a section on how to identify  potential problems. Should any problems arise concerning this procedure, the patient was given instructions to immediately contact us , at any time, without hesitation. In any case, we plan to contact the patient by telephone for a follow-up status report regarding this interventional procedure.  Comments:  No additional relevant information.  Plan of Care (POC)  Orders:  Orders Placed This Encounter  Procedures   DG PAIN CLINIC C-ARM 1-60 MIN NO REPORT    Intraoperative interpretation by procedural physician at [facility].    Standing Status:   Standing    Number of Occurrences:   1    Reason for exam::   Assistance in needle guidance and placement for procedures requiring needle placement in or near specific anatomical locations not easily accessible without such assistance.     Medications ordered for procedure: Meds ordered this encounter  Medications   lidocaine  (XYLOCAINE ) 2 % (with pres) injection 400 mg   ropivacaine  (PF) 2 mg/mL (0.2%) (NAROPIN ) injection 18 mL   dexamethasone  (DECADRON ) injection 20 mg   Medications administered: We administered lidocaine , ropivacaine  (PF) 2 mg/mL (0.2%), and dexamethasone .  See the medical record for exact dosing, route, and time of administration.    B/L cluneal NB 05/14/24    Follow-up plan:   Return in about 3 weeks (around 06/04/2024) for PPE, F2F (s/p cluneal NB).     Recent Visits Date Type Provider Dept  04/05/24 Office Visit Marcelino Nurse, MD [facility]  Showing recent visits within past 90 days and meeting all other requirements Today's Visits Date Type Provider Dept  05/14/24 Procedure visit Marcelino Nurse, MD [facility]  Showing today's visits and meeting all other requirements Future Appointments Date Type Provider Dept  06/05/24 Appointment Marcelino Nurse, MD [facility]  Showing future appointments within next 90 days and meeting all other requirements   Disposition:  Discharge home  Discharge (Date  Time): 05/14/2024; 1455 hrs.   Primary Care Physician: Derick Leita POUR, MD Location: [facility] Note by: Nurse Marcelino, MD (TTS technology used. I apologize for any typographical errors that were  not detected and corrected.) Date: 05/14/2024; Time: 2:48 PM  Disclaimer:  Medicine is not an Visual merchandiser. The only guarantee in medicine is that nothing is guaranteed. It is important to note that the decision to proceed with this intervention was based on the information collected from the patient. The Data and conclusions were drawn from the patient's questionnaire, the interview, and the physical examination. Because the information was provided in large part by the patient, it cannot be guaranteed that it has not been purposely or unconsciously manipulated. Every effort has been made to obtain as much relevant data as possible for this evaluation. It is important to note that the conclusions that lead to this procedure are derived in large part from the available data. Always take into account that the treatment will also be dependent on availability of resources and existing treatment guidelines, considered by other Pain Management Practitioners as being common knowledge and practice, at the time of the intervention. For Medico-Legal purposes, it is also important to point out that variation in procedural techniques and pharmacological choices are the acceptable norm. The indications, contraindications, technique, and results of the above procedure should only be interpreted and judged by a Board-Certified Interventional Pain Specialist with extensive familiarity and expertise in the same exact procedure and technique.

## 2024-05-14 NOTE — Patient Instructions (Signed)

## 2024-05-14 NOTE — Progress Notes (Signed)
 Safety precautions to be maintained throughout the outpatient stay will include: orient to surroundings, keep bed in low position, maintain call bell within reach at all times, provide assistance with transfer out of bed and ambulation.

## 2024-05-16 ENCOUNTER — Telehealth: Payer: Self-pay

## 2024-05-16 NOTE — Telephone Encounter (Signed)
 Post procedure follow up.  LM

## 2024-05-23 ENCOUNTER — Other Ambulatory Visit: Payer: Self-pay | Admitting: Family Medicine

## 2024-05-23 DIAGNOSIS — Z1231 Encounter for screening mammogram for malignant neoplasm of breast: Secondary | ICD-10-CM

## 2024-06-05 ENCOUNTER — Ambulatory Visit: Attending: Student in an Organized Health Care Education/Training Program | Admitting: Nurse Practitioner

## 2024-06-05 ENCOUNTER — Encounter: Payer: Self-pay | Admitting: Nurse Practitioner

## 2024-06-05 VITALS — BP 117/74 | HR 82 | Temp 98.4°F | Resp 18 | Ht 61.0 in | Wt 222.0 lb

## 2024-06-05 DIAGNOSIS — G894 Chronic pain syndrome: Secondary | ICD-10-CM | POA: Diagnosis present

## 2024-06-05 DIAGNOSIS — G588 Other specified mononeuropathies: Secondary | ICD-10-CM | POA: Insufficient documentation

## 2024-06-05 DIAGNOSIS — M47816 Spondylosis without myelopathy or radiculopathy, lumbar region: Secondary | ICD-10-CM | POA: Insufficient documentation

## 2024-06-05 NOTE — Progress Notes (Signed)
 PROVIDER NOTE: Interpretation of information contained herein should be left to medically-trained personnel. Specific patient instructions are provided elsewhere under Patient Instructions section of medical record. This document was created in part using AI and STT-dictation technology, any transcriptional errors that may result from this process are unintentional.  Patient: Kristen Velasquez  Service: E/M   PCP: Derick Leita POUR, MD  DOB: [birth year]  DOS: 06/05/2024  Provider: Emmy POUR Blanch, NP  MRN: [id number]  Delivery: Face-to-face  Specialty: Interventional Pain Management  Type: Established Patient  Setting: Ambulatory outpatient facility  Specialty designation: 09  Referring Prov.: Derick Leita POUR, MD  Location: Outpatient office facility       History of present illness (HPI) Ms. Kristen Velasquez, a 63 y.o. 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 [name] primary complain today is Cluneal neuropathy.   Pertinent problems: Ms. Kristen Velasquez  has Acute pancreatitis; Cluneal neuropathy; Lumbar spondylosis; Chronic radicular lumbar pain; Chronic pain syndrome; and History of alcohol abuse on their problem list.  Pain Assessment: Severity of Chronic pain is reported as a 2 /10. Location: Back Lower/Into both hips legs and ankles. Onset: More than a month ago. Quality: Aching, Constant, Sharp, Numbness, Tender, Stabbing. Timing: Constant. Modifying factor(s): Ice, Procedure. Vitals:  height is 5' 1 (1.549 m) and weight is 222 lb (100.7 kg). Her oral temperature is 98.4 F (36.9 C). Her blood pressure is 117/74 and her pulse is 82. Her respiration is 18 and oxygen saturation is 96%.  BMI: Estimated body mass index is 41.95 kg/m as calculated from the following:   Height as of this encounter: 5' 1 (1.549 m).   Weight as of this encounter: 222 lb (100.7 kg).  Last encounter: Visit date not found. Last procedure: Visit date not found.  Reason for encounter:  post-procedure evaluation and assessment.  Ms. Quinnell underwent a diagnostic/therapeutic bilateral cluneal nerve block on May 14, 2024.  She reports 100% pain relief and functional improvement during local anesthetic phase, followed sustained 80% pain relief and functional improvement since the procedure.  The patient reports experiencing pain only when bending; otherwise, she denies any pain while walking, working, or standing.   Discussed the use of AI scribe software for clinical note transcription with the patient, who gave verbal consent to proceed.  History of Present Illness   Kristen Velasquez is a 64 year old female who presents for follow-up of sacral pain management.  She experienced significant relief from sacral pain following a cluneal nerve block performed on May 14, 2024, with an approximately 80% pain pain relief and functional improvement since the procedure.  The pain is primarily located in the sacrum and exacerbates with bending, particularly at work, where she describes it as 'terrible'. She does not take any medication for the pain when bending.  She has a history of undergoing radiofrequency ablation, which provided relief for two to three months initially. The pain returned after a few months.  She also mentions a knee issue for which she had surgery. Currently, she feels as though her knee 'just wants to break in half' and experiences pain. She plans to have an x-ray to further evaluate the knee condition with her Orthopedic doctor.     Procedure Procedure Bilateral cluneal nerve block for cluneal neuralgia   Imaging: Fluoroscopy-guided Non-spinal ([id number]) Anesthesia: Local anesthesia (1-2% Lidocaine ) DOS: 05/14/2024  Performed by: Wallie Sherry, MD   Purpose: Diagnostic/Therapeutic Indications: Cluneal neuralgia Rationale (medical necessity): procedure needed and  proper for the diagnosis and/or treatment of Kristen Velasquez's medical symptoms and needs. 1.  Cluneal neuropathy   2. Chronic pain syndrome     NAS-11 Pain score:        Pre-procedure: 7 /10        Post-procedure: 2 /10   Post-Procedure Evaluation    Effectiveness:  Initial hour after procedure: 100 % . Subsequent 4-6 hours post-procedure: 100 % . Analgesia past initial 6 hours: 80 % (Feels most pain when bending) . Ongoing improvement:  Analgesic:  Kristen Velasquez underwent a diagnostic/therapeutic bilateral cluneal nerve block on May 14, 2024.  She reports 100% pain relief and functional improvement during local anesthetic phase, followed sustained 80% pain relief and functional improvement since the procedure.  She reported pre procedure pain level 7/10 and today she reported postprocedure pain level 2/10.  Function: Kristen Velasquez reports improvement in function ROM: Kristen Velasquez reports improvement in ROM  Pharmacotherapy Assessment   Monitoring: [place] PMP: PDMP not reviewed this encounter.       Pharmacotherapy: No side-effects or adverse reactions reported. Compliance: No problems identified. Effectiveness: Clinically acceptable.  Kristen Velasquez, [place]  06/05/2024 10:30 AM  Sign when Signing Visit Safety precautions to be maintained throughout the outpatient stay will include: orient to surroundings, keep bed in low position, maintain call bell within reach at all times, provide assistance with transfer out of bed and ambulation.     UDS:  No results found for: SUMMARY  No results found for: CBDTHCR No results found for: D8THCCBX No results found for: D9THCCBX  ROS  Constitutional: Denies any fever or chills Gastrointestinal: No reported hemesis, hematochezia, vomiting, or acute GI distress Musculoskeletal: Sacrum pain, low back pain Neurological: No reported episodes of acute onset apraxia, aphasia, dysarthria, agnosia, amnesia, paralysis, loss of coordination, or loss of consciousness  Medication Review  Multiple Vitamin, albuterol , atorvastatin,  benzonatate , buPROPion, doxepin, fluticasone , gabapentin, hydrochlorothiazide, lidocaine , losartan, losartan-hydrochlorothiazide, meloxicam , methocarbamol , naltrexone, omeprazole, ondansetron , sertraline, tiZANidine, tirzepatide, and venlafaxine   History Review  Allergy: Ms. Nutter is allergic to oxycodone-acetaminophen , percocet [oxycodone-acetaminophen ], ace inhibitors, hydrocodone-acetaminophen , ibuprofen, morphine , vicodin [hydrocodone-acetaminophen ], and codeine. Drug: Ms. Abate  reports no history of drug use. Alcohol:  reports current alcohol use of about 14.0 standard drinks of alcohol per week. Tobacco:  reports that she has been smoking cigarettes. She has never used smokeless tobacco. Social: Ms. Fix  reports that she has been smoking cigarettes. She has never used smokeless tobacco. She reports current alcohol use of about 14.0 standard drinks of alcohol per week. She reports that she does not use drugs. Medical:  has a past medical history of Asthma, Depression, GERD (gastroesophageal reflux disease), and Hypertension. Surgical: Ms. Trevathan  has a past surgical history that includes Gastric bypass and Rotator cuff repair (Right). Family: family history includes Hypertension in her mother.  Laboratory Chemistry Profile   Renal Lab Results  Component Value Date   BUN 10 05/23/2020   CREATININE 0.64 05/23/2020   GFRAA >60 05/23/2020   GFRNONAA >60 05/23/2020    Hepatic Lab Results  Component Value Date   AST 32 05/23/2020   ALT 18 05/23/2020   ALBUMIN 3.9 05/23/2020   ALKPHOS 100 05/23/2020   LIPASE 90 (H) 05/23/2020    Electrolytes Lab Results  Component Value Date   NA 138 05/23/2020   K 3.8 05/23/2020   CL 103 05/23/2020   CALCIUM 8.9 05/23/2020    Bone No results found for: VD25OH, VD125OH2TOT, CI6874NY7, CI7874NY7, 25OHVITD1, 25OHVITD2, 25OHVITD3, TESTOFREE,  TESTOSTERONE  Inflammation (CRP: Acute Phase) (ESR: Chronic Phase) No results found  for: CRP, ESRSEDRATE, LATICACIDVEN       Note: Above Lab results reviewed.  Recent Imaging Review  DG PAIN CLINIC C-ARM 1-60 MIN NO REPORT Fluoro was used, but no Radiologist interpretation will be provided.  Please refer to NOTES tab for provider progress note. Note: Reviewed        Physical Exam  Vitals: BP 117/74 (BP Location: Right Arm, Patient Position: Sitting)   Pulse 82   Temp 98.4 F (36.9 C) (Oral)   Resp 18   Ht 5' 1 (1.549 m)   Wt 222 lb (100.7 kg)   SpO2 96%   BMI 41.95 kg/m  BMI: Estimated body mass index is 41.95 kg/m as calculated from the following:   Height as of this encounter: 5' 1 (1.549 m).   Weight as of this encounter: 222 lb (100.7 kg). Ideal: Ideal body weight: 47.8 kg (105 lb 6.1 oz) Adjusted ideal body weight: 69 kg (152 lb 0.5 oz) General appearance: Well nourished, well developed, and well hydrated. In no apparent acute distress Mental status: Alert, oriented x 3 (person, place, & time)       Respiratory: No evidence of acute respiratory distress Eyes: PERLA  Musculoskeletal: + LBP and Sacrum pain  Assessment   Diagnosis Status  1. Cluneal neuropathy   2. Chronic pain syndrome   3. Lumbar facet arthropathy   4. Lumbar spondylosis    Controlled Controlled Controlled   Updated Problems: No problems updated.  Plan of Care  Problem-specific:  Assessment and Plan    Cluneal neuropathy:  Chronic low back pain with significant relief post-cluneal nerve block, achieving 80% pain relief. Pain exacerbated by bending, manageable with current treatment. Previous radiofrequency ablation provided 2-3 months relief. - Consider Sprint PNS at L4 bilateral for long-term pain relief. Temporary implant for 60 days, potential relief for 12-24 months. - Consider repeat lumbar radiofrequency ablation for extended relief, based on symptom chronicity and degeneration. - Schedule follow-up in three months to assess cluneal nerve block  effectiveness. - Coordinate PRN follow-up with current provider or Dr. Marcelino.   Lumbar facet arthropathy:  Consider repeat lumbar radiofrequency ablation for extended relief, based on symptom chronicity and degeneration.      Ms. SHYAH CADMUS has a current medication list which includes the following long-term medication(s): albuterol , atorvastatin, bupropion, doxepin, fluticasone , gabapentin, losartan-hydrochlorothiazide, omeprazole, sertraline, venlafaxine , hydrochlorothiazide, and losartan.  Pharmacotherapy (Medications Ordered): No orders of the defined types were placed in this encounter.  Orders:  No orders of the defined types were placed in this encounter.       Return for (PRN), Emmy Blanch NP.    Recent Visits Date Type Provider Dept  05/14/24 Procedure visit Marcelino Nurse, MD [facility]  04/05/24 Office Visit Marcelino Nurse, MD [facility]  Showing recent visits within past 90 days and meeting all other requirements Today's Visits Date Type Provider Dept  06/05/24 Office Visit Quintana Canelo K, NP [facility]  Showing today's visits and meeting all other requirements Future Appointments No visits were found meeting these conditions. Showing future appointments within next 90 days and meeting all other requirements  I discussed the assessment and treatment plan with the patient. The patient was provided an opportunity to ask questions and all were answered. The patient agreed with the plan and demonstrated an understanding of the instructions.  Patient advised to call back or seek an in-person evaluation if  the symptoms or condition worsens.  I personally spent a total of 20 minutes in the care of the patient today including preparing to see the patient, getting/reviewing separately obtained history, performing a medically appropriate exam/evaluation, counseling and educating, placing orders, referring and communicating with other health  care professionals, documenting clinical information in the EHR, independently interpreting results, communicating results, and coordinating care.   Note by: Amazing Cowman K Lyndsee Casa, NP (TTS and AI technology used. I apologize for any typographical errors that were not detected and corrected.) Date: 06/05/2024; Time: 12:04 PM

## 2024-06-05 NOTE — Progress Notes (Signed)
 Safety precautions to be maintained throughout the outpatient stay will include: orient to surroundings, keep bed in low position, maintain call bell within reach at all times, provide assistance with transfer out of bed and ambulation.

## 2024-06-21 ENCOUNTER — Ambulatory Visit
Admission: RE | Admit: 2024-06-21 | Discharge: 2024-06-21 | Disposition: A | Discharge status: Home / Self Care | Source: Ambulatory Visit | Attending: Family Medicine | Admitting: Family Medicine

## 2024-06-21 DIAGNOSIS — Z1231 Encounter for screening mammogram for malignant neoplasm of breast: Secondary | ICD-10-CM | POA: Diagnosis present

## 2024-06-27 DIAGNOSIS — M1711 Unilateral primary osteoarthritis, right knee: Secondary | ICD-10-CM | POA: Insufficient documentation

## 2024-09-12 ENCOUNTER — Ambulatory Visit
Admission: EM | Admit: 2024-09-12 | Discharge: 2024-09-12 | Disposition: A | Discharge status: Home / Self Care | Source: Home / Self Care | Attending: Family Medicine | Admitting: Family Medicine

## 2024-09-12 ENCOUNTER — Ambulatory Visit

## 2024-09-12 DIAGNOSIS — I10 Essential (primary) hypertension: Secondary | ICD-10-CM | POA: Insufficient documentation

## 2024-09-12 DIAGNOSIS — E785 Hyperlipidemia, unspecified: Secondary | ICD-10-CM | POA: Insufficient documentation

## 2024-09-12 DIAGNOSIS — M25512 Pain in left shoulder: Secondary | ICD-10-CM | POA: Diagnosis not present

## 2024-09-12 DIAGNOSIS — G4730 Sleep apnea, unspecified: Secondary | ICD-10-CM | POA: Insufficient documentation

## 2024-09-12 DIAGNOSIS — G8929 Other chronic pain: Secondary | ICD-10-CM | POA: Diagnosis not present

## 2024-09-12 DIAGNOSIS — M25511 Pain in right shoulder: Secondary | ICD-10-CM

## 2024-09-12 DIAGNOSIS — M19012 Primary osteoarthritis, left shoulder: Secondary | ICD-10-CM

## 2024-09-12 DIAGNOSIS — M19011 Primary osteoarthritis, right shoulder: Secondary | ICD-10-CM | POA: Diagnosis not present

## 2024-09-12 MED ORDER — MELOXICAM 7.5 MG PO TABS: 7.5000 mg | ORAL_TABLET | Freq: Two times a day (BID) | ORAL | 0 refills | Status: AC | PRN

## 2024-09-12 MED ORDER — DEXAMETHASONE SOD PHOSPHATE PF 10 MG/ML IJ SOLN
10.0000 mg | Freq: Once | INTRAMUSCULAR | Status: AC
  Administered 2024-09-12: 10 mg via INTRAMUSCULAR

## 2024-09-12 MED ORDER — LIDOCAINE 5 % EX PTCH: 1.0000 | MEDICATED_PATCH | CUTANEOUS | 0 refills | Status: AC

## 2024-09-12 NOTE — ED Provider Notes (Signed)
 " [facility]    CSN: [id number] Arrival date & time: 09/12/24  1404      History   Chief Complaint Chief Complaint  Patient presents with   Shoulder Pain   Knee Pain   Fall    HPI  HPI Kristen LEFEBER is a 64 y.o. female.   Elta presents for bilateral shoulder pain and right knee pain for the past month after a fall.  Has chronic pain in these locations as well.  She got out of her fleeta to smoke a cigarette on the low porch. She went backward and knee went forward. She had rotator cuff surgery done on both shoulder. She gets shots in her joints.  Pain is worse at night and at rest.  Has achy and throbbing pain.  Shoulder radiates down her arm and down her right leg.  Been taking Tylenol  and applying ice. Was told previously she needed a knee replacement.     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      Home Medications    Prior to  Admission medications  Medication Sig Start Date End Date Taking? Authorizing Provider  esomeprazole (NEXIUM) 20 MG capsule Take 20 mg by mouth daily. 07/25/24  Yes [provider]  albuterol  (PROVENTIL  HFA;VENTOLIN  HFA) 108 (90 BASE) MCG/ACT inhaler Inhale 2 puffs into the lungs every 6 (six) hours as needed for wheezing or shortness of breath.    [provider]  atorvastatin (LIPITOR) 20 MG tablet Take 20 mg by mouth daily. 03/26/24   [provider]  buPROPion (WELLBUTRIN XL) 150 MG 24 hr tablet Take 150 mg by mouth. 10/13/22   [provider]  doxepin (SINEQUAN) 75 MG capsule Take 75 mg by mouth at bedtime. 06/04/24   [provider]  fluticasone  (FLONASE ) 50 MCG/ACT nasal spray Place 1 spray into both nostrils daily.    [provider]  gabapentin (NEURONTIN) 300 MG capsule Take 2 capsules p.o. in the morning, 1 capsule p.o. at lunchtime and 2 capsules p.o. in the evening 01/30/24   [provider]  lidocaine  (LIDODERM ) 5 % Place 1 patch onto the skin daily. Remove & Discard patch within 12 hours or as directed by MD 09/12/24   Rosy Estabrook, DO  losartan-hydrochlorothiazide (HYZAAR) 100-12.5 MG tablet Take 1 tablet by mouth daily.    [provider]  losartan-hydrochlorothiazide (HYZAAR) 100-25 MG per tablet Take 1 tablet  by mouth daily.    [provider]  meloxicam  (MOBIC ) 7.5 MG tablet Take 1 tablet (7.5 mg total) by mouth 2 (two) times daily as needed for pain. Take with food 09/12/24   Haeli Gerlich, DO  methocarbamol  (ROBAXIN ) 750 MG tablet Take 1 tablet (750 mg total) by mouth 3 (three) times daily. As necessary for muscle spasm 10/19/18   Lacinda Fallow P, PA-C  MOUNJARO 2.5 MG/0.5ML Pen Inject 2.5 mg into the skin once a week. 05/03/24   [provider]  Multiple Vitamins-Minerals (MULTIVITAMIN ADULT PO) Take 1 tablet by mouth daily.    [provider]  naltrexone (DEPADE) 50 MG tablet Take  50 mg by mouth daily.    [provider]  omeprazole (PRILOSEC) 20 MG capsule Take 20 mg by mouth 2 (two) times daily before a meal.    [provider]  ondansetron  (ZOFRAN  ODT) 4 MG disintegrating tablet Take 1 tablet (4 mg total) by mouth every 8 (eight) hours as needed. 05/24/20   Edelmiro Leash, MD  sertraline (ZOLOFT) 100 MG tablet Take 100 mg by mouth 2 (two) times daily.    [provider]  tiZANidine (ZANAFLEX) 2 MG tablet Take 2 mg by mouth at bedtime as needed.    [provider]  venlafaxine  (EFFEXOR ) 75 MG tablet Take 75 mg by mouth 3 (three) times daily with meals.    [provider]    Family History Family History  Problem Relation Age of Onset   Hypertension Mother    Breast cancer Neg Hx     Social History Social History[1]   Allergies   Oxycodone-acetaminophen , Percocet [oxycodone-acetaminophen ], Ace inhibitors, Hydrocodone-acetaminophen , Ibuprofen, Morphine , Vicodin [hydrocodone-acetaminophen ], and Codeine   Review of Systems Review of Systems: :negative unless otherwise stated in HPI.      Physical Exam Triage Vital Signs ED Triage Vitals  Encounter Vitals Group     BP 09/12/24 1416 (!) 141/86     Girls Systolic BP Percentile --      Girls Diastolic BP Percentile --      Boys Systolic BP Percentile --      Boys Diastolic BP Percentile --      Pulse Rate 09/12/24 1416 68     Resp 09/12/24 1416 16     Temp 09/12/24 1416 97.7 F (36.5 C)     Temp Source 09/12/24 1416 Oral     SpO2 09/12/24 1416 94 %     Weight 09/12/24 1414 228 lb 1.6 oz (103.5 kg)     Height --      Head Circumference --      Peak Flow --      Pain Score 09/12/24 1415 5     Pain Loc --      Pain Education --      Exclude from Growth Chart --    No data found.  Updated Vital Signs BP (!) 141/86 (BP Location: Left Arm)   Pulse 68   Temp 97.7 F (36.5 C) (Oral)   Resp 16   Wt 103.5 kg   SpO2 94%   BMI 43.10 kg/m   Visual  Acuity Right Eye Distance:   Left Eye Distance:   Bilateral Distance:    Right Eye Near:   Left Eye Near:    Bilateral Near:     Physical Exam GEN: well appearing female in no acute distress  CVS: well perfused  RESP: speaking in full sentences without pause, no respiratory distress  MSK:  Bilateral shoulder:  No evidence of bony deformity, asymmetry, or muscle atrophy. No tenderness over long head of biceps (bicipital groove).  + bilateral TTP at [facility] joint.  Good right active and passive (ABD, ADD, Flexion, extension, IR, ER). Limited active ROM of right shoulder 2/2 to pain.  Strength 5/5 grip and elbow bilaterally,  right shoulder 4/5 strength  No abnormal scapular function observed.  Special Tests: Hawkins: positive bilaterally; Empty Can: positive on right, Neer's: Negative; Painful arc: Negative; Anterior Apprehension: Negative Sensation intact. Peripheral pulses intact.    UC Treatments / Results  Labs (all labs ordered are listed, but only abnormal results are displayed) Labs Reviewed - No data to display  EKG   Radiology DG Shoulder Right Result Date: 09/12/2024 CLINICAL DATA:  Right shoulder pain after fall 1 month ago. EXAM: RIGHT SHOULDER - 2+ VIEW COMPARISON:  None Available. FINDINGS: Mild degenerative changes of the right AC joint and glenohumeral joints. No evidence of acute fracture or dislocation. Remainder of the exam is unremarkable. IMPRESSION: 1. No acute findings. 2. Mild degenerative changes. Electronically Signed   By: Toribio Agreste M.D.   On: 09/12/2024 15:10   DG Shoulder Left Result Date: 09/12/2024 CLINICAL DATA:  Left shoulder pain 1 month after fall. EXAM: LEFT SHOULDER - 2+ VIEW COMPARISON:  None Available. FINDINGS: Mild degenerative changes of the left AC joint and glenohumeral joints. No acute fracture or dislocation. Remainder the exam is unremarkable. IMPRESSION: 1. No acute findings. 2. Mild degenerative changes. Electronically Signed    By: Toribio Agreste M.D.   On: 09/12/2024 15:09     Procedures Procedures (including critical care time)  Medications Ordered in UC Medications  dexamethasone  (DECADRON ) injection 10 mg (10 mg Intramuscular Given 09/12/24 1506)    Initial Impression / Assessment and Plan / UC Course  I have reviewed the triage vital signs and the nursing notes.  Pertinent labs & imaging results that were available during my care of the patient were reviewed by me and considered in my medical decision making (see chart for details).      Pt is a 64 y.o.  female with 30 days of acute on chronic right knee pain with bilateral shoulder pain after a fall on the porch.   VSS. She is afebrile. She requests xrays of both her shoulders.   She gets left and right subacromial injections in her bursa and right knee at [facility] orth[facility]. Last performed in early November 2025, per chart review.  Right knee plain films obtained that same day revealed the right knee with medial compartment narrowing with almost bone-on-bone pathology.   It is not recommended to do intra-articular injections in less than 90 days therefore this was not performed here today.   Patient agreeable to decadron  injection as these have helped her in the past.  Given Decadron  10 mg IM.  Advised to monitor blood pressure and blood sugar since she was given a steroid shot.    Radiologist impression reviewed and shows some age-related arthritic changes.   Patient to gradually return to normal activities, as tolerated and continue ordinary activities within the limits permitted by pain. Prescribed lidocaine  patches and Mobic  BID  for pain relief.  Tylenol  PRN. Advised patient to avoid OTC NSAIDs while taking prescription NSAID. Counseled patient on red flag symptoms and when to seek immediate care.  No red flags such as progressive major motor weakness.   Patient to follow up with orthopedic provider, if symptoms do not improve with conservative  treatment.   Return and ED precautions given. Understanding voiced. Discussed MDM, treatment plan and plan for follow-up with patient who agrees with plan.   Final Clinical Impressions(s) / UC Diagnoses   Final diagnoses:  Chronic pain of both shoulders  Primary osteoarthritis of both shoulders     Discharge Instructions      Your shoulder x-ray shows some age-related arthritic changes.  Take the meloxicam  7.5 mg tablets twice a day with food, as needed.  Continue taking the Tylenol  and your muscle relaxer Tizanidine, as discussed.  Apply the lidocaine  patch to the painful areas where for 12 hours and then remove.  You can repeat this process the next day.  Follow-up with your orthopedic provider for ongoing pain.     ED Prescriptions     Medication Sig Dispense Auth. Provider   lidocaine  (LIDODERM ) 5 % Place 1 patch onto the skin daily. Remove & Discard patch within 12 hours or as directed by MD 30 patch Kenesha Moshier, DO   meloxicam  (MOBIC ) 7.5 MG tablet Take 1 tablet (7.5 mg total) by mouth 2 (two) times daily as needed for pain. Take with food 30 tablet Jayline Kilburg, DO      I have reviewed the PDMP during this encounter.     [1]  Social History Tobacco Use   Smoking status: Every Day    Current packs/day: 0.25    Types: Cigarettes   Smokeless tobacco: Never  Vaping Use   Vaping status: Never Used  Substance Use Topics   Alcohol use: Yes    Alcohol/week: 14.0 standard drinks of alcohol    Types: 14 Cans of beer per week   Drug use: No     Kenadee Gates, DO 09/14/24 1904  "

## 2024-09-12 NOTE — Discharge Instructions (Addendum)
 Your shoulder x-ray shows some age-related arthritic changes.  Take the meloxicam  7.5 mg tablets twice a day with food, as needed.  Continue taking the Tylenol  and your muscle relaxer Tizanidine, as discussed.  Apply the lidocaine  patch to the painful areas where for 12 hours and then remove.  You can repeat this process the next day.  Follow-up with your orthopedic provider for ongoing pain.

## 2024-09-12 NOTE — ED Triage Notes (Signed)
 Pt c/o bilateral shoulder & R knee pain x1 mon d/t fall. States she was sitting outside on bottom step & as she was getting up she slipped. No OTC meds.
